# Patient Record
Sex: Male | Born: 1985 | Race: Black or African American | Hispanic: No | Marital: Single | Smoking: Current some day smoker
Health system: Southern US, Community
[De-identification: ages and names within clinical notes are randomized; demographics above are authoritative.]

---
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
## 2009-11-19 ENCOUNTER — Emergency Department (HOSPITAL_COMMUNITY): Admission: EM | Admit: 2009-11-19 | Discharge: 2009-11-19 | Payer: Self-pay | Admitting: Emergency Medicine

## 2011-10-23 ENCOUNTER — Emergency Department (INDEPENDENT_AMBULATORY_CARE_PROVIDER_SITE_OTHER)
Admission: EM | Admit: 2011-10-23 | Discharge: 2011-10-23 | Disposition: A | Discharge status: Home / Self Care | Payer: Self-pay | Source: Home / Self Care

## 2011-10-23 ENCOUNTER — Encounter (HOSPITAL_COMMUNITY): Payer: Self-pay | Admitting: *Deleted

## 2011-10-23 DIAGNOSIS — M545 Low back pain: Secondary | ICD-10-CM

## 2011-10-23 MED ORDER — CYCLOBENZAPRINE HCL 10 MG PO TABS: 10.0000 mg | ORAL_TABLET | Freq: Three times a day (TID) | ORAL | Status: AC | PRN

## 2011-10-23 MED ORDER — MELOXICAM 15 MG PO TABS: 15.0000 mg | ORAL_TABLET | Freq: Every day | ORAL | Status: AC

## 2011-10-23 NOTE — Discharge Instructions (Signed)
Thank you for coming in today.  You should get better in about a week. Come back sooner if you get worse. Come back or go to the emergency room if you notice new weakness new numbness problems walking or bowel or bladder problems.  Back Pain, Adult Low back pain is very common. About 1 in 5 people have back pain.The cause of low back pain is rarely dangerous. The pain often gets better over time.About half of people with a sudden onset of back pain feel better in just 2 weeks. About 8 in 10 people feel better by 6 weeks.  CAUSES Some common causes of back pain include:  Strain of the muscles or ligaments supporting the spine.   Wear and tear (degeneration) of the spinal discs.   Arthritis.   Direct injury to the back.  DIAGNOSIS Most of the time, the direct cause of low back pain is not known.However, back pain can be treated effectively even when the exact cause of the pain is unknown.Answering your caregiver's questions about your overall health and symptoms is one of the most accurate ways to make sure the cause of your pain is not dangerous. If your caregiver needs more information, he or she may order lab work or imaging tests (X-rays or MRIs).However, even if imaging tests show changes in your back, this usually does not require surgery. HOME CARE INSTRUCTIONS For many people, back pain returns.Since low back pain is rarely dangerous, it is often a condition that people can learn to [facility] their own.   Remain active. It is stressful on the back to sit or stand in one place. Do not sit, drive, or stand in one place for more than 30 minutes at a time. Take short walks on level surfaces as soon as pain allows.Try to increase the length of time you walk each day.   Do not stay in bed.Resting more than 1 or 2 days can delay your recovery.   Do not avoid exercise or work.Your body is made to move.It is not dangerous to be active, even though your back may hurt.Your back will  likely heal faster if you return to being active before your pain is gone.   Pay attention to your body when you bend and lift. Many people have less discomfortwhen lifting if they bend their knees, keep the load close to their bodies,and avoid twisting. Often, the most comfortable positions are those that put less stress on your recovering back.   Find a comfortable position to sleep. Use a firm mattress and lie on your side with your knees slightly bent. If you lie on your back, put a pillow under your knees.   Only take over-the-counter or prescription medicines as directed by your caregiver. Over-the-counter medicines to reduce pain and inflammation are often the most helpful.Your caregiver may prescribe muscle relaxant drugs.These medicines help dull your pain so you can more quickly return to your normal activities and healthy exercise.   Put ice on the injured area.   Put ice in a plastic bag.   Place a towel between your skin and the bag.   Leave the ice on for 15 to 20 minutes, 3 to 4 times a day for the first 2 to 3 days. After that, ice and heat may be alternated to reduce pain and spasms.   Ask your caregiver about trying back exercises and gentle massage. This may be of some benefit.   Avoid feeling anxious or stressed.Stress increases muscle tension and  can worsen back pain.It is important to recognize when you are anxious or stressed and learn ways to manage it.Exercise is a great option.  SEEK MEDICAL CARE IF:  You have pain that is not relieved with rest or medicine.   You have pain that does not improve in 1 week.   You have new symptoms.   You are generally not feeling well.  SEEK IMMEDIATE MEDICAL CARE IF:   You have pain that radiates from your back into your legs.   You develop new bowel or bladder control problems.   You have unusual weakness or numbness in your arms or legs.   You develop nausea or vomiting.   You develop abdominal pain.   You  feel faint.  Document Released: 05/07/2005 Document Revised: 04/26/2011 Document Reviewed: 09/25/2010 [facility] Patient Information 2012 [place], [place].

## 2011-10-23 NOTE — ED Notes (Signed)
Pt  treports    Low  Back  Pain     X  4  Days  He  Reports  Pain  Developed  About  4 hours     afetr  Bending and  Working on his  Car       Prior to the pain staring  He  Reports  Pain is  Worse  On movement  He  Ambulated  To the room with  A  Slow    Gait      He  Is  howver  Sitting upright on the  Exam table

## 2011-10-23 NOTE — ED Provider Notes (Signed)
William Vasquez is a 26 y.o. male who presents to Urgent Care today for low back pain starting 4 days ago.  Patient denies any injury. The pain does not radiate and he is not have weakness numbness loss of coordination difficulty walking or bowel or bladder dysfunction.  He took some ibuprofen which helped some. He has not had an injury like this in the past. He feels well otherwise.    PMH reviewed. Otherwise healthy young man History  Substance Use Topics  . Smoking status: Not on file  . Smokeless tobacco: Not on file  . Alcohol Use: Not on file   ROS as above Medications reviewed. No current facility-administered medications for this encounter.   Current Outpatient Prescriptions  Medication Sig Dispense Refill  . cyclobenzaprine (FLEXERIL) 10 MG tablet Take 1 tablet (10 mg total) by mouth 3 (three) times daily as needed for muscle spasms.  30 tablet  0  . meloxicam (MOBIC) 15 MG tablet Take 1 tablet (15 mg total) by mouth daily.  14 tablet  0    Exam:  BP 145/78  Pulse 82  Temp(Src) 98.4 F (36.9 C) (Oral)  Resp 16  SpO2 100% Gen: Well NAD Lungs: CTABL Nl WOB Heart: RRR no MRG Exts: Non edematous BL  LE, warm and well perfused.  Back: Nontender over spinal midline. Mildly tender over the SI joints bilaterally.  Strength and sensation is intact as are reflexes which are equal bilaterally. Patient can bend over and touch his toes stand on his toes and heels and walks normally.  He is able to get onto and off of the exam table by himself.  No results found for this or any previous visit (from the past 24 hour(s)). No results found.  Assessment and Plan: 26 y.o. male with low back muscle strain.  Plan to treat conservatively with meloxicam and Flexeril. Discussed warning signs or symptoms. Please see discharge instructions patient expresses understanding. Followup in 2 weeks if not improved.     Rodolph Bong, MD 10/23/11 Ebony Cargo

## 2011-10-23 NOTE — ED Provider Notes (Signed)
Medical screening examination/treatment/procedure(s) were performed by a resident physician and as supervising physician I was immediately available for consultation/collaboration.  Leslee Home, M.D.   Reuben Likes, MD 10/23/11 [phone]

## 2011-10-30 ENCOUNTER — Encounter (HOSPITAL_COMMUNITY): Payer: Self-pay

## 2011-10-30 ENCOUNTER — Emergency Department (INDEPENDENT_AMBULATORY_CARE_PROVIDER_SITE_OTHER)
Admission: EM | Admit: 2011-10-30 | Discharge: 2011-10-30 | Disposition: A | Discharge status: Home / Self Care | Payer: Self-pay | Source: Home / Self Care | Attending: Emergency Medicine | Admitting: Emergency Medicine

## 2011-10-30 DIAGNOSIS — L0291 Cutaneous abscess, unspecified: Secondary | ICD-10-CM

## 2011-10-30 MED ORDER — SULFAMETHOXAZOLE-TMP DS 800-160 MG PO TABS: 2.0000 | ORAL_TABLET | Freq: Two times a day (BID) | ORAL | Status: AC

## 2011-10-30 MED ORDER — MUPIROCIN 2 % EX OINT: TOPICAL_OINTMENT | Freq: Three times a day (TID) | CUTANEOUS | Status: AC

## 2011-10-30 NOTE — ED Notes (Signed)
New chest tattoo on 5-30 after having used Nair, clippers, and razor to remove hair. Since then , has developed infected bumps inches, arms, back

## 2011-10-30 NOTE — Discharge Instructions (Signed)
Abscess An abscess (boil or furuncle) is an infected area that contains a collection of pus.  SYMPTOMS Signs and symptoms of an abscess include pain, tenderness, redness, or hardness. You may feel a moveable soft area under your skin. An abscess can occur anywhere in the body.  TREATMENT  A surgical cut (incision) may be made over your abscess to drain the pus. Gauze may be packed into the space or a drain may be looped through the abscess cavity (pocket). This provides a drain that will allow the cavity to heal from the inside outwards. The abscess may be painful for a few days, but should feel much better if it was drained.  Your abscess, if seen early, may not have localized and may not have been drained. If not, another appointment may be required if it does not get better on its own or with medications. HOME CARE INSTRUCTIONS   Only take over-the-counter or prescription medicines for pain, discomfort, or fever as directed by your caregiver.   Take your antibiotics as directed if they were prescribed. Finish them even if you start to feel better.   Keep the skin and clothes clean around your abscess.   If the abscess was drained, you will need to use gauze dressing to collect any draining pus. Dressings will typically need to be changed 3 or more times a day.   The infection may spread by skin contact with others. Avoid skin contact as much as possible.   Practice good hygiene. This includes regular hand washing, cover any draining skin lesions, and do not share personal care items.   If you participate in sports, do not share athletic equipment, towels, whirlpools, or personal care items. Shower after every practice or tournament.   If a draining area cannot be adequately covered:   Do not participate in sports.   Children should not participate in day care until the wound has healed or drainage stops.   If your caregiver has given you a follow-up appointment, it is very important  to keep that appointment. Not keeping the appointment could result in a much worse infection, chronic or permanent injury, pain, and disability. If there is any problem keeping the appointment, you must call back to this facility for assistance.  SEEK MEDICAL CARE IF:   You develop increased pain, swelling, redness, drainage, or bleeding in the wound site.   You develop signs of generalized infection including muscle aches, chills, fever, or a general ill feeling.   You have an oral temperature above 102 F (38.9 C).  MAKE SURE YOU:   Understand these instructions.   Will watch your condition.   Will get help right away if you are not doing well or get worse.  Document Released: 02/14/2005 Document Revised: 04/26/2011 Document Reviewed: 12/09/2007 [employer] Patient Information 2012 [employer].Community-Associated MRSA CA-MRSA stands for community-associated methicillin-resistant Staphylococcus aureus. MRSA is a type of bacteria that is resistant to some common antibiotics. It can cause infections in the skin and many other places in the body. Staphylococcus aureus, often called "staph," is a bacteria that normally lives on the skin or in the nose. Staph on the surface of the skin or in the nose does not cause problems. However, if the staph enters the body through a cut, wound, or break in the skin, an infection can happen. Up until recently, infections with the MRSA type of staph mainly occurred in hospitals and other healthcare settings. There are now increasing problems with MRSA infections in   the community as well. Infections with MRSA may be very serious or even life-threatening. CA-MRSA is becoming more common. It is known to spread in crowded settings, in jails and prisons, and in situations where there is close skin-to-skin contact, such as during sporting events or in locker rooms. MRSA can be spread through shared items, such as children's toys, razors, towels, or sports equipment.    CAUSES All staph, including MRSA, are normally harmless unless they enter the body through a scratch, cut, or wound, such as with surgery. All staph, including MRSA, can be spread from person-to-person by touching contaminated objects or through direct contact.  MRSA now causes illness in people who have not been in hospitals or other healthcare facilities. Cases of MRSA diseases in the community have been associated with:   Recent antibiotic use.   Sharing contaminated towels or clothes.   Having active skin diseases.   Participating in contact sports.   Living in crowded settings.   Intravenous (IV) drug use.   Community-associated MRSA infections are usually skin infections, but may cause other severe illnesses.   Staph bacteria are one of the most common causes of skin infection. However, they are also a common cause of pneumonia, bone or joint infections, and bloodstream infections.  DIAGNOSIS Diagnosis of MRSA is done by cultures of fluid samples that may come from:  Swabs taken from cuts or wounds in infected areas.   Nasal swabs.   Saliva or deep cough specimens from the lungs (sputum).   Urine.   Blood.  Many people are "colonized" with MRSA but have no signs of infection. This means that people carry the MRSA germ on their skin or in their nose and may never develop MRSA infection.  TREATMENT  Treatment varies and is based on how serious, how deep, or how extensive the infection is. For example:  Some skin infections, such as a small boil or abscess, may be treated by draining yellowish-white fluid (pus) from the site of the infection.   Deeper or more widespread soft tissue infections are usually treated with surgery to drain pus and with antibiotic medicine given by vein or by mouth. This may be recommended even if you are pregnant.   Serious infections may require a hospital stay.  If antibiotics are given, they may be needed for several  weeks. PREVENTION Because many people are colonized with staph, including MRSA, preventing the spread of the bacteria from person-to-person is most important. The best way to prevent the spread of bacteria and other germs is through proper hand washing or by using alcohol-based hand disinfectants. The following are other ways to help prevent MRSA infection within community settings.   Wash your hands frequently with soap and water for at least 15 seconds. Otherwise, use alcohol-based hand disinfectants when soap and water is not available.   Make sure people who live with you wash their hands often, too.   Do not share personal items. For example, avoid sharing razors and other personal hygiene items, towels, clothing, and athletic equipment.   Wash and dry your clothes and bedding at the warmest temperatures recommended on the labels.   Keep wounds covered. Pus from infected sores may contain MRSA and other bacteria. Keep cuts and abrasions clean and covered with germ-free (sterile), dry bandages until they are healed.   If you have a wound that appears infected, ask your caregiver if a culture for MRSA and other bacteria should be done.   If you are breastfeeding,   talk to your caregiver about MRSA. You may be asked to temporarily stop breastfeeding.  HOME CARE INSTRUCTIONS   Take your antibiotics as directed. Finish them even if you start to feel better.   Avoid close contact with those around you as much as possible. Do not use towels, razors, toothbrushes, bedding, or other items that will be used by others.   To fight the infection, follow your caregiver's instructions for wound care. Wash your hands before and after changing your bandages.   If you have an intravascular device, such as a catheter, make sure you know how to care for it.   Be sure to tell any healthcare providers that you have MRSA so they are aware of your infection.  SEEK IMMEDIATE MEDICAL CARE IF:  The infection  appears to be getting worse. Signs include:   Increased warmth, redness, or tenderness around the wound site.   A red line that extends from the infection site.   A dark color in the area around the infection.   Wound drainage that is tan, yellow, or green.   A bad smell coming from the wound.   You feel sick to your stomach (nauseous) and throw up (vomit) or cannot keep medicine down.   You have a fever.   Your baby is older than 3 months with a rectal temperature of 102 F (38.9 C) or higher.   Your baby is 3 months old or younger with a rectal temperature of 100.4 F (38 C) or higher.   You have difficulty breathing.  MAKE SURE YOU:   Understand these instructions.   Will watch your condition.   Will get help right away if you are not doing well or get worse.  Document Released: 08/10/2005 Document Revised: 04/26/2011 Document Reviewed: 08/10/2010 [employer] Patient Information 2012 [employer]. 

## 2011-10-30 NOTE — ED Provider Notes (Signed)
Chief Complaint  Patient presents with  . Skin Problem    History of Present Illness:   The patient is a 26 year old male who had a large tattoo on his chest on May 28. Thereafter he noted a small pustule on his anterior chest in the area of the tattoo. Since then he's developed about a dozen more pustules both in the area of the tattoo and outside the area as well. These have drained a small amount of pus. He has not had any fever or chills. The lesions have been located in the chest and upper extremities and he has a large one in his right axilla. He has no prior history of skin infections, abscesses, or MRSA. He has no history of diabetes.  Review of Systems:  Other than noted above, the patient denies any of the following symptoms: Systemic:  No fever, chills, sweats, weight loss, or fatigue. ENT:  No nasal congestion, rhinorrhea, sore throat, swelling of lips, tongue or throat. Resp:  No cough, wheezing, or shortness of breath. Skin:  No rash, itching, nodules, or suspicious lesions.  PMFSH:  Past medical history, family history, social history, meds, and allergies were reviewed.  Physical Exam:   Vital signs:  BP 121/73  Pulse 78  Temp(Src) 98.3 F (36.8 C) (Oral)  Resp 16  SpO2 98% Gen:  Alert, oriented, in no distress. ENT:  Pharynx clear, no intraoral lesions, moist mucous membranes. Lungs:  Clear to auscultation. Skin:  He has about a dozen pustules ranging in size from just a few millimeters to the largest being in the right axilla which was about a centimeter. Some of these have dried up and crusted over, some are draining a small amount of pus, and the lesion in the right axilla feels firm, but there is no fluctuance. These are all located in the chest and upper extremities. They're not on the face, head, neck, back, genital area, or lower extremities.  Other Labs Obtained at Urgent Care Center:  One of the draining lesions on the left upper arm was cultured.  Results are  pending at this time and we will call about any positive results.  Assessment:  The encounter diagnosis was Abscess. He has multiple small abscesses, these are probably staph for MRSA and most likely are due to infection that he caught when he got the tattoo.  Plan:   1.  The following meds were prescribed:   New Prescriptions   MUPIROCIN OINTMENT (BACTROBAN) 2 %    Apply topically 3 (three) times daily.   SULFAMETHOXAZOLE-TRIMETHOPRIM (BACTRIM DS) 800-160 MG PER TABLET    Take 2 tablets by mouth 2 (two) times daily.   2.  The patient was instructed in symptomatic care and handouts were given. 3.  The patient was told to return if becoming worse in any way, if no better in 3 or 4 days, and given some red flag symptoms that would indicate earlier return.     Reuben Likes, MD 10/30/11 [phone]

## 2011-11-02 LAB — WOUND CULTURE

## 2011-11-05 ENCOUNTER — Telehealth (HOSPITAL_COMMUNITY): Payer: Self-pay | Admitting: *Deleted

## 2011-11-05 NOTE — ED Notes (Signed)
Pt. called back and verified x 2. Pt. given results and told he was adequately treated with Bactrim DS. Pt. given MRSA instructions and voiced understanding. Vassie Moselle 11/05/2011

## 2013-10-23 ENCOUNTER — Encounter (HOSPITAL_COMMUNITY): Payer: Self-pay | Admitting: Emergency Medicine

## 2013-10-23 DIAGNOSIS — R55 Syncope and collapse: Secondary | ICD-10-CM | POA: Insufficient documentation

## 2013-10-23 DIAGNOSIS — F17200 Nicotine dependence, unspecified, uncomplicated: Secondary | ICD-10-CM | POA: Insufficient documentation

## 2013-10-23 DIAGNOSIS — Z79899 Other long term (current) drug therapy: Secondary | ICD-10-CM | POA: Insufficient documentation

## 2013-10-23 DIAGNOSIS — R42 Dizziness and giddiness: Secondary | ICD-10-CM | POA: Insufficient documentation

## 2013-10-23 LAB — CBC WITH DIFFERENTIAL/PLATELET
Basophils Absolute: 0 10*3/uL (ref 0.0–0.1)
Basophils Relative: 0 % (ref 0–1)
EOS ABS: 0.3 10*3/uL (ref 0.0–0.7)
EOS PCT: 5 % (ref 0–5)
HCT: 38.9 % — ABNORMAL LOW (ref 39.0–52.0)
Hemoglobin: 12.9 g/dL — ABNORMAL LOW (ref 13.0–17.0)
LYMPHS ABS: 1.6 10*3/uL (ref 0.7–4.0)
LYMPHS PCT: 27 % (ref 12–46)
MCH: 28.2 pg (ref 26.0–34.0)
MCHC: 33.2 g/dL (ref 30.0–36.0)
MCV: 84.9 fL (ref 78.0–100.0)
MONO ABS: 0.8 10*3/uL (ref 0.1–1.0)
Monocytes Relative: 14 % — ABNORMAL HIGH (ref 3–12)
NEUTROS ABS: 3.1 10*3/uL (ref 1.7–7.7)
Neutrophils Relative %: 54 % (ref 43–77)
Platelets: 242 10*3/uL (ref 150–400)
RBC: 4.58 MIL/uL (ref 4.22–5.81)
RDW: 12.2 % (ref 11.5–15.5)
WBC: 5.9 10*3/uL (ref 4.0–10.5)

## 2013-10-23 NOTE — ED Notes (Signed)
The pt has fainted x 2 tonight one hour ago while getting his hair cut.  He last ate 1800 and then he drank a mixed drink.  At present he feels ok

## 2013-10-24 ENCOUNTER — Emergency Department (HOSPITAL_COMMUNITY)
Admission: EM | Admit: 2013-10-24 | Discharge: 2013-10-24 | Disposition: A | Discharge status: Home / Self Care | Payer: Self-pay | Attending: Emergency Medicine | Admitting: Emergency Medicine

## 2013-10-24 ENCOUNTER — Emergency Department (HOSPITAL_COMMUNITY): Payer: Self-pay

## 2013-10-24 DIAGNOSIS — R55 Syncope and collapse: Secondary | ICD-10-CM

## 2013-10-24 LAB — COMPREHENSIVE METABOLIC PANEL
ALBUMIN: 4 g/dL (ref 3.5–5.2)
ALT: 16 U/L (ref 0–53)
AST: 17 U/L (ref 0–37)
Alkaline Phosphatase: 84 U/L (ref 39–117)
BUN: 9 mg/dL (ref 6–23)
CHLORIDE: 103 meq/L (ref 96–112)
CO2: 27 mEq/L (ref 19–32)
CREATININE: 0.97 mg/dL (ref 0.50–1.35)
Calcium: 9.7 mg/dL (ref 8.4–10.5)
Glucose, Bld: 85 mg/dL (ref 70–99)
POTASSIUM: 3.6 meq/L — AB (ref 3.7–5.3)
Sodium: 141 mEq/L (ref 137–147)
TOTAL PROTEIN: 7.7 g/dL (ref 6.0–8.3)
Total Bilirubin: 0.3 mg/dL (ref 0.3–1.2)

## 2013-10-24 LAB — I-STAT TROPONIN, ED: Troponin i, poc: 0 ng/mL (ref 0.00–0.08)

## 2013-10-24 MED ORDER — SODIUM CHLORIDE 0.9 % IV BOLUS (SEPSIS)
1000.0000 mL | Freq: Once | INTRAVENOUS | Status: AC
  Administered 2013-10-24: 1000 mL via INTRAVENOUS

## 2013-10-24 NOTE — ED Provider Notes (Signed)
CSN: [id number]     Arrival date & time 10/23/13  2316 History   First MD Initiated Contact with Patient 10/24/13 0209     Chief Complaint  Patient presents with  . Near Syncope     (Consider location/radiation/quality/duration/timing/severity/associated sxs/prior Treatment) HPI Patient is a generally healthy 28 year old man who presents after an episode of near syncope. The patient was getting his hair trimmed a barber shop. He stood up from the chair, felt lightheaded and had to sit back down. The same thing happen a couple minutes later. He drank some water and felt better.  The patient denies any history of similar symptoms. He has not had shortness of breath or chest pain. He did his by mouth intake to less than normal today. He has not had any melena or hematochezia. He's not taking any medications. He denies alcohol and recreational drug use today. He is asymptomatic at this time other than feeling somewhat tired.  History reviewed. No pertinent past medical history. History reviewed. No pertinent past surgical history. No family history on file. History  Substance Use Topics  . Smoking status: Current Every Day Smoker  . Smokeless tobacco: Not on file  . Alcohol Use: Yes    Review of Systems Ten point review of symptoms performed and is negative with the exception of symptoms noted above.     Allergies  Review of patient's allergies indicates no known allergies.  Home Medications   Prior to Admission medications   Not on File   BP 129/72  Pulse 74  Temp(Src) 98.5 F (36.9 C) (Oral)  Resp 13  Ht 6\' 1"  (1.854 m)  Wt 235 lb 6.4 oz (106.777 kg)  BMI 31.06 kg/m2  SpO2 98% Physical Exam Gen: well developed and well nourished appearing Head: NCAT Eyes: PERL, EOMI Nose: no epistaixis or rhinorrhea Mouth/throat: mucosa is moist and pink Neck: supple, no stridor Lungs: CTA B, no wheezing, rhonchi or rales CV: RRR, no murmur, extremities appear well perfused.  Abd:  soft, notender, nondistended Back: no ttp, no cva ttp Skin: warm and dry Ext: normal to inspection, no dependent edema Neuro: CN ii-xii grossly intact, no focal deficits Psyche; normal affect,  calm and cooperative.   ED Course  Procedures (including critical care time) Labs Review Results for orders placed during the hospital encounter of 10/24/13 (from the past 24 hour(s))  CBC WITH DIFFERENTIAL     Status: Abnormal   Collection Time    10/23/13 11:38 PM      Result Value Ref Range   WBC 5.9  4.0 - 10.5 K/uL   RBC 4.58  4.22 - 5.81 MIL/uL   Hemoglobin 12.9 (*) 13.0 - 17.0 g/dL   HCT 72.5 (*) 36.6 - 44.0 %   MCV 84.9  78.0 - 100.0 fL   MCH 28.2  26.0 - 34.0 pg   MCHC 33.2  30.0 - 36.0 g/dL   RDW 34.7  42.5 - 95.6 %   Platelets 242  150 - 400 K/uL   Neutrophils Relative % 54  43 - 77 %   Neutro Abs 3.1  1.7 - 7.7 K/uL   Lymphocytes Relative 27  12 - 46 %   Lymphs Abs 1.6  0.7 - 4.0 K/uL   Monocytes Relative 14 (*) 3 - 12 %   Monocytes Absolute 0.8  0.1 - 1.0 K/uL   Eosinophils Relative 5  0 - 5 %   Eosinophils Absolute 0.3  0.0 - 0.7 K/uL   Basophils  Relative 0  0 - 1 %   Basophils Absolute 0.0  0.0 - 0.1 K/uL  COMPREHENSIVE METABOLIC PANEL     Status: Abnormal   Collection Time    10/23/13 11:38 PM      Result Value Ref Range   Sodium 141  137 - 147 mEq/L   Potassium 3.6 (*) 3.7 - 5.3 mEq/L   Chloride 103  96 - 112 mEq/L   CO2 27  19 - 32 mEq/L   Glucose, Bld 85  70 - 99 mg/dL   BUN 9  6 - 23 mg/dL   Creatinine, Ser 1.610.97  0.50 - 1.35 mg/dL   Calcium 9.7  8.4 - 09.610.5 mg/dL   Total Protein 7.7  6.0 - 8.3 g/dL   Albumin 4.0  3.5 - 5.2 g/dL   AST 17  0 - 37 U/L   ALT 16  0 - 53 U/L   Alkaline Phosphatase 84  39 - 117 U/L   Total Bilirubin 0.3  0.3 - 1.2 mg/dL   GFR calc non Af Amer >90  >90 mL/min   GFR calc Af Amer >90  >90 mL/min  I-STAT TROPOININ, ED     Status: None   Collection Time    10/24/13  2:27 AM      Result Value Ref Range   Troponin i, poc 0.00   0.00 - 0.08 ng/mL   Comment 3             CXR: normal cardiac silloute, normal appearing mediastinum, no infiltrates, no acute process identified.   EKG: nsr, no acute ischemic changes, normal intervals, normal axis, normal qrs complex  MDM   DDX: volume depletion, electrolyte disturbance, AKI, dysrthmia unlikely in this healthy young man.   ED work up is reassuring with normal CBC, CXR, EKG. Patient is feeling better after NS bolus. Stable for d/c.     Brandt LoosenJulie Manly, MD 10/24/13 [phone]

## 2013-10-24 NOTE — Discharge Instructions (Signed)
Near-Syncope °Near-syncope (commonly known as near fainting) is sudden weakness, dizziness, or feeling like you might pass out. During an episode of near-syncope, you may also develop pale skin, have tunnel vision, or feel sick to your stomach (nauseous). Near-syncope may occur when getting up after sitting or while standing for a long time. It is caused by a sudden decrease in blood flow to the brain. This decrease can result from various causes or triggers, most of which are not serious. However, because near-syncope can sometimes be a sign of something serious, a medical evaluation is required. The specific cause is often not determined. °HOME CARE INSTRUCTIONS  °Monitor your condition for any changes. The following actions may help to alleviate any discomfort you are experiencing: °· Have someone stay with you until you feel stable. °· Lie down right away if you start feeling like you might faint. Breathe deeply and steadily. Wait until all the symptoms have passed. Most of these episodes last only a few minutes. You may feel tired for several hours.   °· Drink enough fluids to keep your urine clear or pale yellow.   °· If you are taking blood pressure or heart medicine, get up slowly when seated or lying down. Take several minutes to sit and then stand. This can reduce dizziness. °· Follow up with your health care provider as directed.  °SEEK IMMEDIATE MEDICAL CARE IF:  °· You have a severe headache.   °· You have unusual pain in the chest, abdomen, or back.   °· You are bleeding from the mouth or rectum, or you have black or tarry stool.   °· You have an irregular or very fast heartbeat.   °· You have repeated fainting or have seizure-like jerking during an episode.   °· You faint when sitting or lying down.   °· You have confusion.   °· You have difficulty walking.   °· You have severe weakness.   °· You have vision problems.   °MAKE SURE YOU:  °· Understand these instructions. °· Will watch your  condition. °· Will get help right away if you are not doing well or get worse. °Document Released: 05/07/2005 Document Revised: 01/07/2013 Document Reviewed: 10/10/2012 °[employer]® Patient Information ©2014 [employer]. ° °

## 2013-10-24 NOTE — ED Provider Notes (Signed)
CSN: [id number]     Arrival date & time 10/23/13  2316 History   First MD Initiated Contact with Patient 10/24/13 0209     Chief Complaint  Patient presents with  . Near Syncope     (Consider location/radiation/quality/duration/timing/severity/associated sxs/prior Treatment) HPI Patient is a generally healthy 27 and he comes in for 2 episodes of near-syncope. 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 He stood up, felt lightheaded and had to sit back on a chair. The same thing occurred about 20 minutes later. He then sat been trying to follow for her. He felt better.  He feels fatigued at this time. However, it is after 2:00 in the morning. The patient reports normal by mouth intake. He has not had any chest pain, shortness of breath or focal neurologic deficits. No headache. No history of similar symptoms. He says he has had a couple alcoholic beverages prior to this event. Denies any route recent recreational drug use.  History reviewed. No pertinent past medical history. History reviewed. No pertinent past surgical history. No family history on file. History  Substance Use Topics  . Smoking status: Current Every Day Smoker  . Smokeless tobacco: Not on file  . Alcohol Use: Yes    Review of Systems Ten point review of symptoms performed and is negative with the exception of symptoms noted above.     Allergies  Review of patient's allergies indicates no known allergies.  Home Medications   Prior to Admission medications   Medication Sig Start Date End Date Taking? Authorizing Provider  loratadine (CLARITIN) 10 MG tablet Take 10 mg by mouth daily.   Yes Historical Provider, MD   BP 129/72  Pulse 74  Temp(Src) 98.5 F (36.9 C) (Oral)  Resp 13  Ht 6\' 1"  (1.854 m)  Wt 235 lb 6.4 oz (106.777 kg)  BMI 31.06 kg/m2  SpO2 98% Physical Exam Gen: well developed and well nourished appearing Head: NCAT Eyes: PERL, EOMI Nose: no epistaixis or  rhinorrhea Mouth/throat: mucosa is moist and pink Neck: supple, no stridor Lungs: CTA B, no wheezing, rhonchi or rales CV: RRR, no murmur, extremities appear well perfused.Orthostatic VS performed by me and are normal.  Abd: soft, notender, nondistended Back: no ttp, no cva ttp Skin: warm and dry Ext: normal to inspection, no dependent edema Neuro: CN ii-xii grossly intact, no focal deficits Psyche; normal affect,  calm and cooperative.   ED Course  Procedures (including critical care time) Labs Review  Results for orders placed during the hospital encounter of 10/24/13 (from the past 24 hour(s))  CBC WITH DIFFERENTIAL     Status: Abnormal   Collection Time    10/23/13 11:38 PM      Result Value Ref Range   WBC 5.9  4.0 - 10.5 K/uL   RBC 4.58  4.22 - 5.81 MIL/uL   Hemoglobin 12.9 (*) 13.0 - 17.0 g/dL   HCT 27.7 (*) 82.4 - 23.5 %   MCV 84.9  78.0 - 100.0 fL   MCH 28.2  26.0 - 34.0 pg   MCHC 33.2  30.0 - 36.0 g/dL   RDW 36.1  44.3 - 15.4 %   Platelets 242  150 - 400 K/uL   Neutrophils Relative % 54  43 - 77 %   Neutro Abs 3.1  1.7 - 7.7 K/uL   Lymphocytes Relative 27  12 - 46 %   Lymphs Abs 1.6  0.7 - 4.0 K/uL   Monocytes Relative 14 (*)  3 - 12 %   Monocytes Absolute 0.8  0.1 - 1.0 K/uL   Eosinophils Relative 5  0 - 5 %   Eosinophils Absolute 0.3  0.0 - 0.7 K/uL   Basophils Relative 0  0 - 1 %   Basophils Absolute 0.0  0.0 - 0.1 K/uL  COMPREHENSIVE METABOLIC PANEL     Status: Abnormal   Collection Time    10/23/13 11:38 PM      Result Value Ref Range   Sodium 141  137 - 147 mEq/L   Potassium 3.6 (*) 3.7 - 5.3 mEq/L   Chloride 103  96 - 112 mEq/L   CO2 27  19 - 32 mEq/L   Glucose, Bld 85  70 - 99 mg/dL   BUN 9  6 - 23 mg/dL   Creatinine, Ser 1.610.97  0.50 - 1.35 mg/dL   Calcium 9.7  8.4 - 09.610.5 mg/dL   Total Protein 7.7  6.0 - 8.3 g/dL   Albumin 4.0  3.5 - 5.2 g/dL   AST 17  0 - 37 U/L   ALT 16  0 - 53 U/L   Alkaline Phosphatase 84  39 - 117 U/L   Total Bilirubin 0.3   0.3 - 1.2 mg/dL   GFR calc non Af Amer >90  >90 mL/min   GFR calc Af Amer >90  >90 mL/min   EKG: nsr, no acute ischemic changes, normal intervals, normal axis, normal qrs complex  MDM   DDX: CBC, BMP wnl. EKG wnl. Do not suspect cardiogenic syncope. Suspect a vasovagal mediated episode potentiated by alcohol. No signs of significant volume dehydration or anemia. EKG is wnl. WE will tx with NS in anticipation of d/c home.     Brandt LoosenJulie Luise Yamamoto, MD 10/24/13 [phone]

## 2014-03-17 ENCOUNTER — Emergency Department (HOSPITAL_COMMUNITY)
Admission: EM | Admit: 2014-03-17 | Discharge: 2014-03-17 | Disposition: A | Discharge status: Home / Self Care | Payer: Self-pay | Attending: Emergency Medicine | Admitting: Emergency Medicine

## 2014-03-17 ENCOUNTER — Encounter (HOSPITAL_COMMUNITY): Payer: Self-pay | Admitting: Emergency Medicine

## 2014-03-17 ENCOUNTER — Emergency Department (HOSPITAL_COMMUNITY): Payer: Self-pay

## 2014-03-17 DIAGNOSIS — Z720 Tobacco use: Secondary | ICD-10-CM | POA: Insufficient documentation

## 2014-03-17 DIAGNOSIS — Y9389 Activity, other specified: Secondary | ICD-10-CM | POA: Insufficient documentation

## 2014-03-17 DIAGNOSIS — S60131A Contusion of right middle finger with damage to nail, initial encounter: Secondary | ICD-10-CM | POA: Insufficient documentation

## 2014-03-17 DIAGNOSIS — S62639A Displaced fracture of distal phalanx of unspecified finger, initial encounter for closed fracture: Secondary | ICD-10-CM

## 2014-03-17 DIAGNOSIS — L03111 Cellulitis of right axilla: Secondary | ICD-10-CM | POA: Insufficient documentation

## 2014-03-17 DIAGNOSIS — M25511 Pain in right shoulder: Secondary | ICD-10-CM

## 2014-03-17 DIAGNOSIS — Z8614 Personal history of Methicillin resistant Staphylococcus aureus infection: Secondary | ICD-10-CM | POA: Insufficient documentation

## 2014-03-17 DIAGNOSIS — M79646 Pain in unspecified finger(s): Secondary | ICD-10-CM

## 2014-03-17 DIAGNOSIS — S4991XA Unspecified injury of right shoulder and upper arm, initial encounter: Secondary | ICD-10-CM | POA: Insufficient documentation

## 2014-03-17 DIAGNOSIS — Y9289 Other specified places as the place of occurrence of the external cause: Secondary | ICD-10-CM | POA: Insufficient documentation

## 2014-03-17 DIAGNOSIS — X58XXXA Exposure to other specified factors, initial encounter: Secondary | ICD-10-CM | POA: Insufficient documentation

## 2014-03-17 DIAGNOSIS — S62632A Displaced fracture of distal phalanx of right middle finger, initial encounter for closed fracture: Secondary | ICD-10-CM | POA: Insufficient documentation

## 2014-03-17 DIAGNOSIS — L0291 Cutaneous abscess, unspecified: Secondary | ICD-10-CM

## 2014-03-17 MED ORDER — LIDOCAINE-EPINEPHRINE 2 %-1:100000 IJ SOLN
20.0000 mL | Freq: Once | INTRAMUSCULAR | Status: AC
  Administered 2014-03-17: 20 mL via INTRADERMAL
  Filled 2014-03-17: qty 1

## 2014-03-17 MED ORDER — SULFAMETHOXAZOLE-TRIMETHOPRIM 800-160 MG PO TABS: 1.0000 | ORAL_TABLET | Freq: Two times a day (BID) | ORAL | Status: AC

## 2014-03-17 MED ORDER — NAPROXEN 500 MG PO TABS: 500.0000 mg | ORAL_TABLET | Freq: Two times a day (BID) | ORAL | Status: DC

## 2014-03-17 MED ORDER — TRAMADOL HCL 50 MG PO TABS: 50.0000 mg | ORAL_TABLET | Freq: Four times a day (QID) | ORAL | Status: AC | PRN

## 2014-03-17 MED ORDER — IBUPROFEN 800 MG PO TABS
800.0000 mg | ORAL_TABLET | Freq: Once | ORAL | Status: AC
  Administered 2014-03-17: 800 mg via ORAL
  Filled 2014-03-17: qty 1

## 2014-03-17 NOTE — ED Notes (Signed)
Pt c/o pain to middle finger on rt hand from dropping a 35 # weight on finger yesterday.  Also wants to be seen for assorted "boils" on skin.

## 2014-03-17 NOTE — ED Provider Notes (Signed)
Medical screening examination/treatment/procedure(s) were performed by non-physician practitioner and as supervising physician I was immediately available for consultation/collaboration.   EKG Interpretation None        Layla MawKristen N Harneet Noblett, DO 03/17/14 1611

## 2014-03-17 NOTE — ED Notes (Signed)
Ortho called 

## 2014-03-17 NOTE — ED Provider Notes (Signed)
CSN: [id number]     Arrival date & time 03/17/14  [id number] History   First MD Initiated Contact with Patient 03/17/14 1012     Chief Complaint  Patient presents with  . Hand Pain     (Consider location/radiation/quality/duration/timing/severity/associated sxs/prior Treatment) HPI  William Vasquez this 28 year old male who presents emergency department with several complaints. 1) patient complains of pain in the right distal third finger after dropping a 35 pound weight on it yesterday. He had immediate pain and swelling. He has noticed bruising under the distal nail. He had a lot of throbbing pain yesterday however that has diminished. He denies any change in sensations. Pain is currently 7 out of 10. 2) patient complains of pain in the right fifth digit at the PIP joint. States that he was lifting weights several days ago and jammed his finger. He felt it pop and has had pain and swelling in that joint since that time. He feels as if he might have dislocated joint. He continues to have swelling rates the pain at 5 out of 5. He has difficulty making a fist due to swelling. Denies changes in sensation or color. 3) patient complains of pain in the anterior right shoulder after heavy bench pressing and bicep workout yesterday. He denies a history of pain or injury in the right shoulder. He has pain with flexion. He is having some difficulties completing activities of daily living and for today due to hand and shoulder pain. He took 800 mg of ibuprofen without relief. He denies weakness 4) the patient has a history of MRSA. He complains of a boil in the right axilla which is draining mild amount of purulence. Pain is rated at 3 out of 10. It does not radiate. He denies fevers, chills, nausea, vomiting, myalgias or arthralgias.   History reviewed. No pertinent past medical history. No past surgical history on file. No family history on file. History  Substance Use Topics  . Smoking status: Current  Every Day Smoker  . Smokeless tobacco: Not on file  . Alcohol Use: Yes    Review of Systems   Ten systems reviewed and are negative for acute change, except as noted in the HPI.   Allergies  Review of patient's allergies indicates no known allergies.  Home Medications   Prior to Admission medications   Medication Sig Start Date End Date Taking? Authorizing Provider  loratadine (CLARITIN) 10 MG tablet Take 10 mg by mouth daily.    Historical Provider, MD   BP 156/88  Pulse 65  Temp(Src) 97.9 F (36.6 C) (Oral)  Resp 17  SpO2 98% Physical Exam  Nursing note and vitals reviewed. Constitutional: He appears well-developed and well-nourished. No distress.  HENT:  Head: Normocephalic and atraumatic.  Eyes: Conjunctivae are normal. No scleral icterus.  Neck: Normal range of motion. Neck supple.  Cardiovascular: Normal rate, regular rhythm and normal heart sounds.   Pulmonary/Chest: Effort normal and breath sounds normal. No respiratory distress.  Abdominal: Soft. There is no tenderness.  Musculoskeletal: He exhibits no edema.  1) right hand examination reveals bruising of the distal right third finger with subungual hematoma proximal to the lunate line. Mild swelling at the distal end bruising on the pad of the finger. Full range of motion. Tender to palpation. 2) right fifth digit swelling, no discoloration, tender to palpation at the PIP. Unable to flex fully at the PIP. Full extension and strength. 3) right shoulder with full strength and range of motion. He is tender  to palpation over the coracoid process and the short head of the biceps tendon.  Neurological: He is alert.  Skin: Skin is warm and dry. He is not diaphoretic.  2 cm right axillary nodule with purulent punctate drainage, no surrounding induration.  Psychiatric: His behavior is normal.    ED Course  Procedures (including critical care time) Labs Review Labs Reviewed - No data to display  Imaging Review No  results found.   EKG Interpretation None     INCISION AND DRAINAGE Performed by: Arthor CaptainHarris, Laquesha Holcomb Consent: Verbal consent obtained. Risks and benefits: risks, benefits and alternatives were discussed Type: abscess  Body area: Right axilla  Anesthesia: local infiltration  Incision was made with a scalpel.  Local anesthetic: lidocaine 2% w epinephrine  Anesthetic total: 4 ml  Complexity: complex Blunt dissection to break up loculations  Drainage: purulent  Drainage amount: Moderate   Patient tolerance: Patient tolerated the procedure well with no immediate complications.     MDM   Final diagnoses:  Finger pain  Closed fracture of tuft of distal phalanx of finger, initial encounter  Abscess  Right shoulder pain    Patient placed in finger splint. Pain medication including tramadol and naproxen at discharge. Patient will get Bactrim for his abscess considering his history of MRSA. Follow-up with orthopedics. Work note given.    Arthor CaptainAbigail Lameisha Schuenemann, PA-C 03/17/14 1203

## 2014-03-17 NOTE — Discharge Instructions (Signed)
Take your medications as directed. Only take tramadol for severe pain. Do not drink alcohol with this medication. Follow up with the orthopedist for your shoulder (yates) and coley for your hand. Take the naproxen every day for the next 10 days with food. Rest and ice the shoulder and hand. Avoid activity involving your R shoulder such as heavy weight lifting.    Abscess Care After An abscess (also called a boil or furuncle) is an infected area that contains a collection of pus. Signs and symptoms of an abscess include pain, tenderness, redness, or hardness, or you may feel a moveable soft area under your skin. An abscess can occur anywhere in the body. The infection may spread to surrounding tissues causing cellulitis. A cut (incision) by the surgeon was made over your abscess and the pus was drained out. Gauze may have been packed into the space to provide a drain that will allow the cavity to heal from the inside outwards. The boil may be painful for 5 to 7 days. Most people with a boil do not have high fevers. Your abscess, if seen early, may not have localized, and may not have been lanced. If not, another appointment may be required for this if it does not get better on its own or with medications. HOME CARE INSTRUCTIONS   Only take over-the-counter or prescription medicines for pain, discomfort, or fever as directed by your caregiver.  When you bathe, soak and then remove gauze or iodoform packs at least daily or as directed by your caregiver. You may then wash the wound gently with mild soapy water. Repack with gauze or do as your caregiver directs. SEEK IMMEDIATE MEDICAL CARE IF:   You develop increased pain, swelling, redness, drainage, or bleeding in the wound site.  You develop signs of generalized infection including muscle aches, chills, fever, or a general ill feeling.  An oral temperature above 102 F (38.9 C) develops, not controlled by medication. See your caregiver for a  recheck if you develop any of the symptoms described above. If medications (antibiotics) were prescribed, take them as directed. Document Released: 11/23/2004 Document Revised: 07/30/2011 Document Reviewed: 07/21/2007 [facility] Patient Information 2015 [place], [place]. This information is not intended to replace advice given to you by your health care provider. Make sure you discuss any questions you have with your health care provider.  Finger Fracture Fractures of fingers are breaks in the bones of the fingers. There are many types of fractures. There are different ways of treating these fractures. Your health care provider will discuss the best way to treat your fracture. CAUSES Traumatic injury is the main cause of broken fingers. These include:  Injuries while playing sports.  Workplace injuries.  Falls. RISK FACTORS Activities that can increase your risk of finger fractures include:  Sports.  Workplace activities that involve machinery.  A condition called osteoporosis, which can make your bones less dense and cause them to fracture more easily. SIGNS AND SYMPTOMS The main symptoms of a broken finger are pain and swelling within 15 minutes after the injury. Other symptoms include:  Bruising of your finger.  Stiffness of your finger.  Numbness of your finger.  Exposed bones (compound fracture) if the fracture is severe. DIAGNOSIS  The best way to diagnose a broken bone is with X-ray imaging. Additionally, your health care provider will use this X-ray image to evaluate the position of the broken finger bones.  TREATMENT  Finger fractures can be treated with:   Nonreduction--This means  the bones are in place. The finger is splinted without changing the positions of the bone pieces. The splint is usually left on for about a week to 10 days. This will depend on your fracture and what your health care provider thinks.  Closed reduction--The bones are put back into position  without using surgery. The finger is then splinted.  Open reduction and internal fixation--The fracture site is opened. Then the bone pieces are fixed into place with pins or some type of hardware. This is seldom required. It depends on the severity of the fracture. HOME CARE INSTRUCTIONS   Follow your health care provider's instructions regarding activities, exercises, and physical therapy.  Only take over-the-counter or prescription medicines for pain, discomfort, or fever as directed by your health care provider. SEEK MEDICAL CARE IF: You have pain or swelling that limits the motion or use of your fingers. SEEK IMMEDIATE MEDICAL CARE IF:  Your finger becomes numb. MAKE SURE YOU:   Understand these instructions.  Will watch your condition.  Will get help right away if you are not doing well or get worse. Document Released: 08/19/2000 Document Revised: 02/25/2013 Document Reviewed: 12/17/2012 [facility] Patient Information 2015 [place], [place]. This information is not intended to replace advice given to you by your health care provider. Make sure you discuss any questions you have with your health care provider. RICE: Routine Care for Injuries The routine care of many injuries includes Rest, Ice, Compression, and Elevation (RICE). HOME CARE INSTRUCTIONS  Rest is needed to allow your body to heal. Routine activities can usually be resumed when comfortable. Injured tendons and bones can take up to 6 weeks to heal. Tendons are the cord-like structures that attach muscle to bone.  Ice following an injury helps keep the swelling down and reduces pain.  Put ice in a plastic bag.  Place a towel between your skin and the bag.  Leave the ice on for 15-20 minutes, 3-4 times a day, or as directed by your health care provider. Do this while awake, for the first 24 to 48 hours. After that, continue as directed by your caregiver.  Compression helps keep swelling down. It also gives support and  helps with discomfort. If an elastic bandage has been applied, it should be removed and reapplied every 3 to 4 hours. It should not be applied tightly, but firmly enough to keep swelling down. Watch fingers or toes for swelling, bluish discoloration, coldness, numbness, or excessive pain. If any of these problems occur, remove the bandage and reapply loosely. Contact your caregiver if these problems continue.  Elevation helps reduce swelling and decreases pain. With extremities, such as the arms, hands, legs, and feet, the injured area should be placed near or above the level of the heart, if possible. SEEK IMMEDIATE MEDICAL CARE IF:  You have persistent pain and swelling.  You develop redness, numbness, or unexpected weakness.  Your symptoms are getting worse rather than improving after several days. These symptoms may indicate that further evaluation or further X-rays are needed. Sometimes, X-rays may not show a small broken bone (fracture) until 1 week or 10 days later. Make a follow-up appointment with your caregiver. Ask when your X-ray results will be ready. Make sure you get your X-ray results. Document Released: 08/19/2000 Document Revised: 05/12/2013 Document Reviewed: 10/06/2010 [facility] Patient Information 2015 [place], [place]. This information is not intended to replace advice given to you by your health care provider. Make sure you discuss any questions you have with your  health care provider. Biceps Tendon Tendinitis (Proximal) and Tenosynovitis with Rehab Tendonitis and tenosynovitis involve inflammation of the tendon and the tendon lining (sheath). The proximal biceps tendon is vulnerable to tendonitis and tenosynovitis, which causes pain and discomfort in the front of the shoulder and upper arm. The tendon lining secretes a fluid that helps lubricate the tendon, allowing for proper function without pain. When the tendon and its lining become inflamed, the tendon can no longer glide  smoothly, causing pain. The proximal biceps tendon connects the biceps muscle to two bones of the shoulder. It is important for proper function of the elbow and turning the palm upward (supination) using the wrist. Proximal biceps tendon tendinitis may include a grade 1 or 2 strain of the tendon. Grade 1 strains involve a slight pull of the tendon without signs of tearing and no observed tendon lengthening. There is also no loss of strength. Grade 2 strains involve small tears in the tendon fibers. The tendon or muscle is stretched and strength is usually decreased.  SYMPTOMS   Pain, tenderness, swelling, warmth, or redness over the front of the shoulder.  Pain that gets worse with shoulder and elbow use, especially against resistance.  Limited motion of the shoulder or elbow.  Crackling sound (crepitation) when the tendon or shoulder is moved or touched. CAUSES  The symptoms of biceps tendonitis are due to inflammation of the tendon. Inflammation may be caused by:  Strain from sudden increase in amount or intensity of activity.  Direct blow or injury to the elbow (uncommon).  Overuse or repetitive elbow bending or wrist rotation, particularly when turning the palm up, or with elbow hyperextension. RISK INCREASES WITH:  Sports that involve contact or overhead arm activity (throwing sports, gymnastics, weightlifting, bodybuilding, rock climbing).  Heavy labor.  Poor strength and flexibility.  Failure to warm up properly before activity. PREVENTION  Warm up and stretch properly before activity.  Allow time for recovery between activities.  Maintain physical fitness:  Strength, flexibility, and endurance.  Cardiovascular fitness.  Learn and use proper exercise technique. PROGNOSIS  With proper treatment, proximal biceps tendon tendonitis and tenosynovitis is usually curable within 6 weeks. Healing is usually quicker if the cause was a direct blow, not overuse.  RELATED  COMPLICATIONS   Longer healing time if not properly treated or if not given enough time to heal.  Chronically inflamed tendon that causes persistent pain with activity, that may progress to constant pain and potentially rupture of the tendon.  Recurring symptoms, especially if activity is resumed too soon or with overuse, a direct blow, or use of poor exercise technique. TREATMENT Treatment first involves ice and medicine, to reduce pain and inflammation. It is helpful to modify activities that cause pain, to reduce the chances of causing the condition to get worse. Strengthening and stretching exercises should be performed to promote proper use of the muscles of the shoulder. These exercises may be performed at home or with a therapist. Other treatments may be given such as ultrasound or heat therapy. A corticosteroid injection may be recommended to help reduce inflammation of the tendon lining. Surgery is usually not necessary. Sometimes, if symptoms last for greater than 6 months, surgery will be advised to detach the tendon and re-insert it into the arm bone. Surgery to correct other shoulder problems that may be contributing to tendinitis may be advised before surgery for the tendinitis itself.  MEDICATION  If pain medicine is needed, nonsteroidal anti-inflammatory medicines (aspirin and ibuprofen), or  other minor pain relievers (acetaminophen), are often advised.  Do not take pain medicine for 7 days before surgery.  Prescription pain relievers may be given if your caregiver thinks they are needed. Use only as directed and only as much as you need.  Corticosteroid injections may be given. These injections should only be used on the most severe cases, as one can only receive a limited number of them. HEAT AND COLD   Cold treatment (icing) should be applied for 10 to 15 minutes every 2 to 3 hours for inflammation and pain, and immediately after activity that aggravates your symptoms. Use ice  packs or an ice massage.  Heat treatment may be used before performing stretching and strengthening activities prescribed by your caregiver, physical therapist, or athletic trainer. Use a heat pack or a warm water soak. SEEK MEDICAL CARE IF:   Symptoms get worse or do not improve in 2 weeks, despite treatment.  New, unexplained symptoms develop. (Drugs used in treatment may produce side effects.) EXERCISES RANGE OF MOTION (ROM) AND EXERCISES - Biceps Tendon (Proximal) and Tenosynovitis These exercises may help you when beginning to rehabilitate your injury. Your symptoms may go away with or without further involvement from your physician, physical therapist, or athletic trainer. While completing these exercises, remember:   Restoring tissue flexibility helps normal motion to return to the joints. This allows healthier, less painful movement and activity.  An effective stretch should be held for at least 30 seconds.  A stretch should never be painful. You should only feel a gentle lengthening or release in the stretched tissue. STRETCH - Flexion, Standing  Stand with good posture. With an underhand grip on your right / left hand and an overhand grip on the opposite hand, grasp a broomstick or cane so that your hands are a little more than shoulder width apart.  Keeping your right / left elbow straight and shoulder muscles relaxed, push the stick with your opposite hand to raise your right / left arm in front of your body and then overhead. Raise your arm until you feel a stretch in your right / left shoulder, but before you have increased shoulder pain.  Try to avoid shrugging your right / left shoulder as your arm rises, by keeping your shoulder blade tucked down and toward your mid-back spine. Hold for __________ seconds.  Slowly return to the starting position. Repeat __________ times. Complete this exercise __________ times per day. STRETCH - Abduction, Supine  Lie on your back. With  an underhand grip on your right / left hand and an overhand grip on the opposite hand, grasp a broomstick or cane so that your hands are a little more than shoulder width apart.  Keeping your right / left elbow straight and shoulder muscles relaxed, push the stick with your opposite hand to raise your right / left arm out to the side of your body and then overhead. Raise your arm until you feel a stretch in your right / left shoulder, but before you have increased shoulder pain.  Try to avoid shrugging your right / left shoulder as your arm rises, by keeping your shoulder blade tucked down and toward your mid-back spine. Hold for __________ seconds.  Slowly return to the starting position. Repeat __________ times. Complete this exercise __________ times per day. ROM - Flexion, Active-Assisted  Lie on your back. You may bend your knees for comfort.  Grasp a broomstick or cane so your hands are about shoulder width apart. Your right /  left hand should grip the end of the stick so that your hand is positioned "thumbs-up," as if you were about to shake hands.  Using your healthy arm to lead, raise your right / left arm overhead until you feel a gentle stretch in your shoulder. Hold for __________ seconds.  Use the stick to assist in returning your right / left arm to its starting position. Repeat __________ times. Complete this exercise __________ times per day.  STRETCH - Flexion, Standing   Stand facing a wall. Walk your right / left fingers up the wall until you feel a moderate stretch in your shoulder. As your hand gets higher, you may need to step closer to the wall or use a door frame to walk through.  Try to avoid shrugging your right / left shoulder as your arm rises, by keeping your shoulder blade tucked down and toward your mid-back spine.  Hold for __________ seconds. Use your other hand, if needed, to ease out of the stretch and return to the starting position. Repeat __________  times. Complete this exercise __________ times per day.  ROM - Internal Rotation   Using underhand grips, grasp a stick behind your back with both hands.  While standing upright with good posture, slide the stick up your back until you feel a mild stretch in the front of your shoulder.  Hold for __________ seconds. Slowly return to your starting position. Repeat __________ times. Complete this exercise __________ times per day.  STRETCH - Internal Rotation  Place your right / left hand behind your back, palm-up.  Throw a towel or belt over your opposite shoulder. Grasp the towel with your right / left hand.  While keeping an upright posture, gently pull up on the towel until you feel a stretch in the front of your right / left shoulder.  Avoid shrugging your right / left shoulder as your arm rises, by keeping your shoulder blade tucked down and toward your mid-back spine.  Hold for __________ seconds. Release the stretch by lowering your opposite hand. Repeat __________ times. Complete this exercise __________ times per day. STRENGTHENING EXERCISES - Biceps Tendon Tendinitis (Proximal) and Tenosynovitis These exercises may help you regain your strength after your physician has discontinued your restraint in a cast or brace. They may resolve your symptoms with or without further involvement from your physician, physical therapist or athletic trainer. While completing these exercises, remember:   Muscles can gain both the endurance and the strength needed for everyday activities through controlled exercises.  Complete these exercises as instructed by your physician, physical therapist or athletic trainer. Increase the resistance and repetitions only as guided.  You may experience muscle soreness or fatigue, but the pain or discomfort you are trying to eliminate should never worsen during these exercises. If this pain does get worse, stop and make sure you are following the directions  exactly. If the pain is still present after adjustments, discontinue the exercise until you can discuss the trouble with your caregiver. STRENGTH - Elbow Flexors, Isometric  Stand or sit upright on a firm surface. Place your right / left arm so that your hand is palm-up and at the height of your waist.  Place your opposite hand on top of your forearm. Gently push down as your right / left arm resists. Push as hard as you can with both arms, without causing any pain or movement at your right / left elbow. Hold this stationary position for __________ seconds.  Gradually release the  tension in both arms. Allow your muscles to relax completely before repeating. Repeat __________ times. Complete this exercise __________ times per day. STRENGTH - Shoulder Flexion, Isometric  With good posture and facing a wall, stand or sit about 4-6 inches away.  Keeping your right / left elbow straight, gently press the top of your fist into the wall. Increase the pressure gradually until you are pressing as hard as you can, without shrugging your shoulder or increasing any shoulder discomfort.  Hold for __________ seconds.  Release the tension slowly. Relax your shoulder muscles completely before you start the next repetition. Repeat __________ times. Complete this exercise __________ times per day.  STRENGTH - Elbow Flexors, Supinated  With good posture, stand or sit on a firm chair without armrests. Allow your right / left arm to rest at your side with your palm facing forward.  Holding a __________ weight, or gripping a rubber exercise band or tubing,  bring your hand toward your shoulder.  Allow your muscles to control the resistance as your hand returns to your side. Repeat __________ times. Complete this exercise __________ times per day.  STRENGTH - Shoulder Flexion  Stand or sit with good posture. Grasp a __________ weight, or an exercise band or tubing, so that your hand is "thumbs-up," like when  you shake hands.  Slowly lift your right / left arm as far as you can, without increasing any shoulder pain. At first, many people can only raise their hand to shoulder height.  Avoid shrugging your right / left shoulder as your arm rises, by keeping your shoulder blade tucked down and toward your mid-back spine.  Hold for __________ seconds. Control the descent of your hand as you slowly return to your starting position. Repeat __________ times. Complete this exercise __________ times per day. Document Released: 05/07/2005 Document Revised: 07/30/2011 Document Reviewed: 08/19/2008 [facility] Patient Information 2015 [place]. This information is not intended to replace advice given to you by your health care provider. Make sure you discuss any questions you have with your health care provider.

## 2014-08-24 ENCOUNTER — Encounter (HOSPITAL_COMMUNITY): Payer: Self-pay

## 2014-08-24 ENCOUNTER — Emergency Department (HOSPITAL_COMMUNITY)
Admission: EM | Admit: 2014-08-24 | Discharge: 2014-08-24 | Disposition: A | Discharge status: Home / Self Care | Payer: Self-pay | Attending: Emergency Medicine | Admitting: Emergency Medicine

## 2014-08-24 DIAGNOSIS — H6091 Unspecified otitis externa, right ear: Secondary | ICD-10-CM | POA: Insufficient documentation

## 2014-08-24 DIAGNOSIS — H9201 Otalgia, right ear: Secondary | ICD-10-CM

## 2014-08-24 DIAGNOSIS — Z720 Tobacco use: Secondary | ICD-10-CM | POA: Insufficient documentation

## 2014-08-24 DIAGNOSIS — J302 Other seasonal allergic rhinitis: Secondary | ICD-10-CM | POA: Insufficient documentation

## 2014-08-24 MED ORDER — NEOMYCIN-POLYMYXIN-HC 3.5-10000-1 OT SUSP: 4.0000 [drp] | Freq: Four times a day (QID) | OTIC | Status: AC

## 2014-08-24 MED ORDER — CETIRIZINE HCL 10 MG PO TABS: 10.0000 mg | ORAL_TABLET | Freq: Every day | ORAL | Status: AC

## 2014-08-24 MED ORDER — NAPROXEN 500 MG PO TABS: 500.0000 mg | ORAL_TABLET | Freq: Two times a day (BID) | ORAL | Status: AC

## 2014-08-24 NOTE — ED Provider Notes (Signed)
CSN: [id number]     Arrival date & time 08/24/14  1023 History   First MD Initiated Contact with Patient 08/24/14 1127     Chief Complaint  Patient presents with  . Otalgia   William Vasquez is a 29 y.o. male who is otherwise healthy who presents to the ED complaining of right ear pain for the past 4 days. He complains of 6/10 ear pain. He has taken nothing for treatment today. He reports using ear pain drops from the drug store and placing cotton in his ear. He reports seeing blood on the end of a q-tip yesterday.  He also reports associated nasal congestion, and sneezing. He denies hearing loss, fevers, sore throat, trouble swallowing, cough or recent ear infections. He denies recent changes in altitude.   (Consider location/radiation/quality/duration/timing/severity/associated sxs/prior Treatment) HPI  History reviewed. No pertinent past medical history. History reviewed. No pertinent past surgical history. History reviewed. No pertinent family history. History  Substance Use Topics  . Smoking status: Current Every Day Smoker  . Smokeless tobacco: Not on file  . Alcohol Use: Yes    Review of Systems  Constitutional: Negative for fever.  HENT: Positive for ear pain. Negative for ear discharge.   Eyes: Negative for pain and visual disturbance.  Skin: Negative for rash.      Allergies  Review of patient's allergies indicates no known allergies.  Home Medications   Prior to Admission medications   Medication Sig Start Date End Date Taking? Authorizing Provider  cetirizine (ZYRTEC ALLERGY) 10 MG tablet Take 1 tablet (10 mg total) by mouth daily. 08/24/14   Everlene Farrier, PA-C  naproxen (NAPROSYN) 500 MG tablet Take 1 tablet (500 mg total) by mouth 2 (two) times daily with a meal. 08/24/14   Everlene Farrier, PA-C  neomycin-polymyxin-hydrocortisone (CORTISPORIN) 3.5-10000-1 otic suspension Place 4 drops into the right ear 4 (four) times daily. 08/24/14   Everlene Farrier, PA-C   sulfamethoxazole-trimethoprim (SEPTRA DS) 800-160 MG per tablet Take 1 tablet by mouth every 12 (twelve) hours. 03/17/14   Arthor Captain, PA-C  traMADol (ULTRAM) 50 MG tablet Take 1 tablet (50 mg total) by mouth every 6 (six) hours as needed. 03/17/14   Abigail Harris, PA-C   BP 135/76 mmHg  Pulse 70  Temp(Src) 98.4 F (36.9 C) (Oral)  Resp 16  SpO2 96% Physical Exam  Constitutional: He is oriented to person, place, and time. He appears well-developed and well-nourished. No distress.  HENT:  Head: Normocephalic and atraumatic.  Right Ear: External ear normal.  Left Ear: External ear normal.  Nose: Nose normal.  Mouth/Throat: Oropharynx is clear and moist. No oropharyngeal exudate.  Bilateral tympanic membranes are pearly-gray without erythema or loss of landmarks. No TM perforation. Some mild right external auditory canal swelling and discharge. Patient removed tissue from ear prior to exam. No blood in ears. No tonsillar hypertrophy. Mild tenderness with manipulation of his right pinna. No mastoid tenderness.   Eyes: Conjunctivae are normal. Pupils are equal, round, and reactive to light. Right eye exhibits no discharge. Left eye exhibits no discharge.  Neck: Normal range of motion. Neck supple. No JVD present. No tracheal deviation present.  No cervical lymphadenopathy.   Cardiovascular: Normal rate, regular rhythm and normal heart sounds.   Pulmonary/Chest: Effort normal. No respiratory distress. He has no wheezes. He has no rales.  Abdominal: Soft. There is no tenderness.  Lymphadenopathy:    He has no cervical adenopathy.  Neurological: He is alert and oriented to person, place, and  time. No cranial nerve deficit. Coordination normal.  Skin: Skin is warm and dry. No rash noted. He is not diaphoretic. No erythema. No pallor.  Psychiatric: He has a normal mood and affect. His behavior is normal.  Nursing note and vitals reviewed.   ED Course  Procedures (including critical care  time) Labs Review Labs Reviewed - No data to display  Imaging Review No results found.   EKG Interpretation None      Filed Vitals:   08/24/14 1120 08/24/14 1202  BP: 135/76   Pulse: 70 70  Temp: 98.4 F (36.9 C)   TempSrc: Oral   Resp: 16   SpO2: 100% 96%     MDM   Meds given in ED:  Medications - No data to display  New Prescriptions   CETIRIZINE (ZYRTEC ALLERGY) 10 MG TABLET    Take 1 tablet (10 mg total) by mouth daily.   NEOMYCIN-POLYMYXIN-HYDROCORTISONE (CORTISPORIN) 3.5-10000-1 OTIC SUSPENSION    Place 4 drops into the right ear 4 (four) times daily.    Final diagnoses:  Ear ache, right  External otitis of right ear  Seasonal allergies   This is a 29 y.o. male who is otherwise healthy who presents to the ED complaining of right ear pain for the past 4 days. He complains of 6/10 ear pain. He has taken nothing for treatment today. He reports using ear pain drops from the drug store and placing cotton in his ear. The patient is afebrile and nontoxic appearing. The patient's bilateral TMs are intact. Hearing is grossly intact. There is a mild amount of external auditory canal edema with a mild amount of discharge. No mastoid tenderness. Mild tenderness with manipulation of his pinna. Will treat for otitis externa, likely caused by him placing cotton and Q-tips into his ear. Will treat for his allergy symptoms with Zyrtec and pain with Naprosyn. I advised the patient to follow-up with their primary care provider this week. I advised the patient to return to the emergency department with new or worsening symptoms or new concerns. The patient verbalized understanding and agreement with plan.   This patient was discussed with Dr. Jodi MourningZavitz who agrees with assessment and plan.     [name], PA-C 08/24/14 1208  Blane OharaJoshua Zavitz, MD 08/28/14 [phone]

## 2014-08-24 NOTE — Discharge Instructions (Signed)
Otitis Externa Otitis externa is a bacterial or fungal infection of the outer ear canal. This is the area from the eardrum to the outside of the ear. Otitis externa is sometimes called "swimmer's ear." CAUSES  Possible causes of infection include:  Swimming in dirty water.  Moisture remaining in the ear after swimming or bathing.  Mild injury (trauma) to the ear.  Objects stuck in the ear (foreign body).  Cuts or scrapes (abrasions) on the outside of the ear. SIGNS AND SYMPTOMS  The first symptom of infection is often itching in the ear canal. Later signs and symptoms may include swelling and redness of the ear canal, ear pain, and yellowish-white fluid (pus) coming from the ear. The ear pain may be worse when pulling on the earlobe. DIAGNOSIS  Your health care provider will perform a physical exam. A sample of fluid may be taken from the ear and examined for bacteria or fungi. TREATMENT  Antibiotic ear drops are often given for 10 to 14 days. Treatment may also include pain medicine or corticosteroids to reduce itching and swelling. HOME CARE INSTRUCTIONS   Apply antibiotic ear drops to the ear canal as prescribed by your health care provider.  Take medicines only as directed by your health care provider.  If you have diabetes, follow any additional treatment instructions from your health care provider.  Keep all follow-up visits as directed by your health care provider. PREVENTION   Keep your ear dry. Use the corner of a towel to absorb water out of the ear canal after swimming or bathing.  Avoid scratching or putting objects inside your ear. This can damage the ear canal or remove the protective wax that lines the canal. This makes it easier for bacteria and fungi to grow.  Avoid swimming in lakes, polluted water, or poorly chlorinated pools.  You may use ear drops made of rubbing alcohol and vinegar after swimming. Combine equal parts of white vinegar and alcohol in a bottle.  Put 3 or 4 drops into each ear after swimming. SEEK MEDICAL CARE IF:   You have a fever.  Your ear is still red, swollen, painful, or draining pus after 3 days.  Your redness, swelling, or pain gets worse.  You have a severe headache.  You have redness, swelling, pain, or tenderness in the area behind your ear. MAKE SURE YOU:   Understand these instructions.  Will watch your condition.  Will get help right away if you are not doing well or get worse. Document Released: 05/07/2005 Document Revised: 09/21/2013 Document Reviewed: 05/24/2011 [facility] Patient Information 2015 [place], [place]. This information is not intended to replace advice given to you by your health care provider. Make sure you discuss any questions you have with your health care provider.  Allergies Allergies may happen from anything your body is sensitive to. This may be food, medicines, pollens, chemicals, and nearly anything around you in everyday life that produces allergens. An allergen is anything that causes an allergy producing substance. Heredity is often a factor in causing these problems. This means you may have some of the same allergies as your parents. Food allergies happen in all age groups. Food allergies are some of the most severe and life threatening. Some common food allergies are cow's milk, seafood, eggs, nuts, wheat, and soybeans. SYMPTOMS   Swelling around the mouth.  An itchy red rash or hives.  Vomiting or diarrhea.  Difficulty breathing. SEVERE ALLERGIC REACTIONS ARE LIFE-THREATENING. This reaction is called anaphylaxis. It can  cause the mouth and throat to swell and cause difficulty with breathing and swallowing. In severe reactions only a trace amount of food (for example, peanut oil in a salad) may cause death within seconds. Seasonal allergies occur in all age groups. These are seasonal because they usually occur during the same season every year. They may be a reaction to molds,  grass pollens, or tree pollens. Other causes of problems are house dust mite allergens, pet dander, and mold spores. The symptoms often consist of nasal congestion, a runny itchy nose associated with sneezing, and tearing itchy eyes. There is often an associated itching of the mouth and ears. The problems happen when you come in contact with pollens and other allergens. Allergens are the particles in the air that the body reacts to with an allergic reaction. This causes you to release allergic antibodies. Through a chain of events, these eventually cause you to release histamine into the blood stream. Although it is meant to be protective to the body, it is this release that causes your discomfort. This is why you were given anti-histamines to feel better. If you are unable to pinpoint the offending allergen, it may be determined by skin or blood testing. Allergies cannot be cured but can be controlled with medicine. Hay fever is a collection of all or some of the seasonal allergy problems. It may often be treated with simple over-the-counter medicine such as diphenhydramine. Take medicine as directed. Do not drink alcohol or drive while taking this medicine. Check with your caregiver or package insert for child dosages. If these medicines are not effective, there are many new medicines your caregiver can prescribe. Stronger medicine such as nasal spray, eye drops, and corticosteroids may be used if the first things you try do not work well. Other treatments such as immunotherapy or desensitizing injections can be used if all else fails. Follow up with your caregiver if problems continue. These seasonal allergies are usually not life threatening. They are generally more of a nuisance that can often be handled using medicine. HOME CARE INSTRUCTIONS   If unsure what causes a reaction, keep a diary of foods eaten and symptoms that follow. Avoid foods that cause reactions.  If hives or rash are present:  Take  medicine as directed.  You may use an over-the-counter antihistamine (diphenhydramine) for hives and itching as needed.  Apply cold compresses (cloths) to the skin or take baths in cool water. Avoid hot baths or showers. Heat will make a rash and itching worse.  If you are severely allergic:  Following a treatment for a severe reaction, hospitalization is often required for closer follow-up.  Wear a medic-alert bracelet or necklace stating the allergy.  You and your family must learn how to give adrenaline or use an anaphylaxis kit.  If you have had a severe reaction, always carry your anaphylaxis kit or EpiPen with you. Use this medicine as directed by your caregiver if a severe reaction is occurring. Failure to do so could have a fatal outcome. SEEK MEDICAL CARE IF:  You suspect a food allergy. Symptoms generally happen within 30 minutes of eating a food.  Your symptoms have not gone away within 2 days or are getting worse.  You develop new symptoms.  You want to retest yourself or your child with a food or drink you think causes an allergic reaction. Never do this if an anaphylactic reaction to that food or drink has happened before. Only do this under the care of  a caregiver. SEEK IMMEDIATE MEDICAL CARE IF:   You have difficulty breathing, are wheezing, or have a tight feeling in your chest or throat.  You have a swollen mouth, or you have hives, swelling, or itching all over your body.  You have had a severe reaction that has responded to your anaphylaxis kit or an EpiPen. These reactions may return when the medicine has worn off. These reactions should be considered life threatening. MAKE SURE YOU:   Understand these instructions.  Will watch your condition.  Will get help right away if you are not doing well or get worse. Document Released: 07/31/2002 Document Revised: 09/01/2012 Document Reviewed: 01/05/2008  [facility] Patient Information 2015 [place], [place]. This  information is not intended to replace advice given to you by your health care provider. Make sure you discuss any questions you have with your health care provider.

## 2014-08-24 NOTE — ED Notes (Signed)
Earache since Saturday.  No fever.  Right side

## 2015-06-23 IMAGING — CR DG CHEST 2V
2 series · 2 of 2 positions shown · non-contrast
Comparison: None.

CLINICAL DATA: Syncopal episode.

EXAM:
CHEST  2 VIEW

[w chest pa]
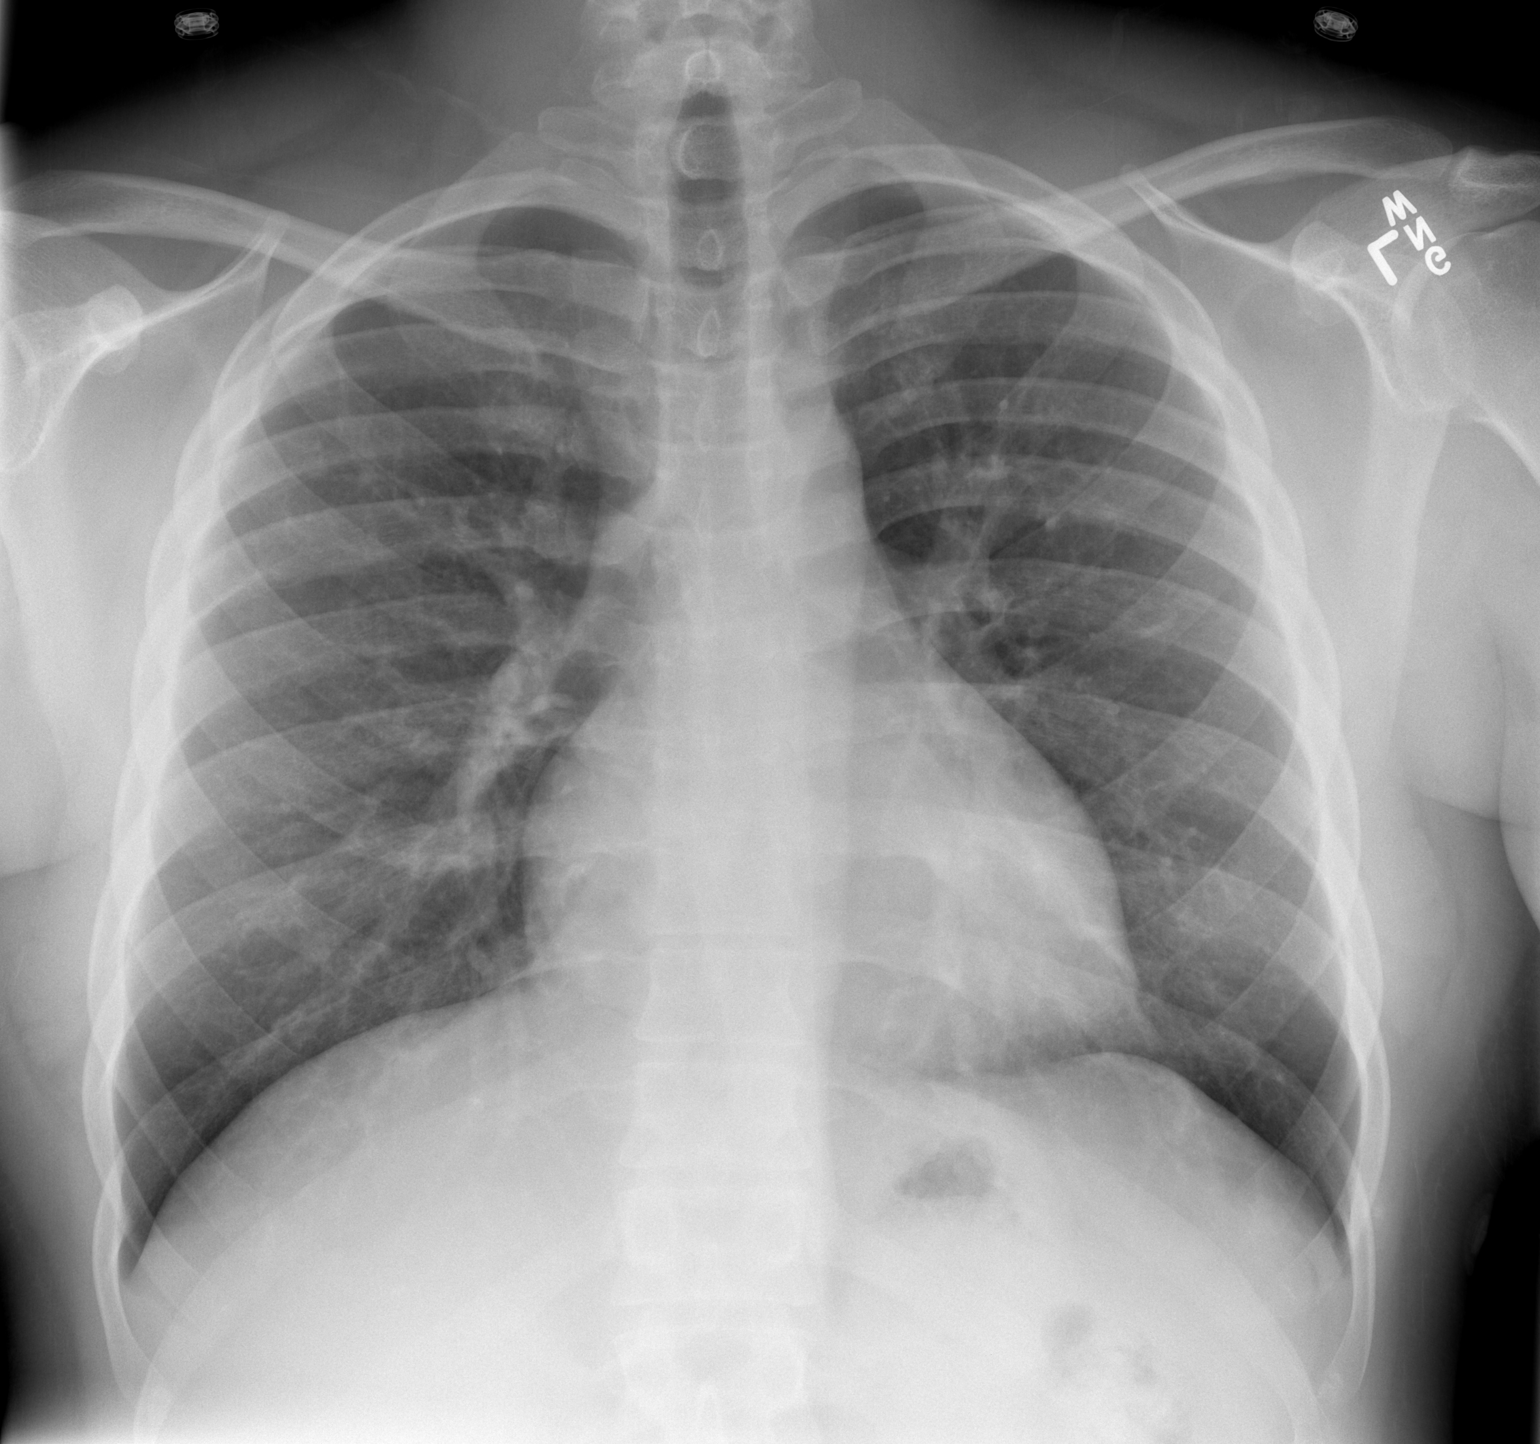

[w chest lat]
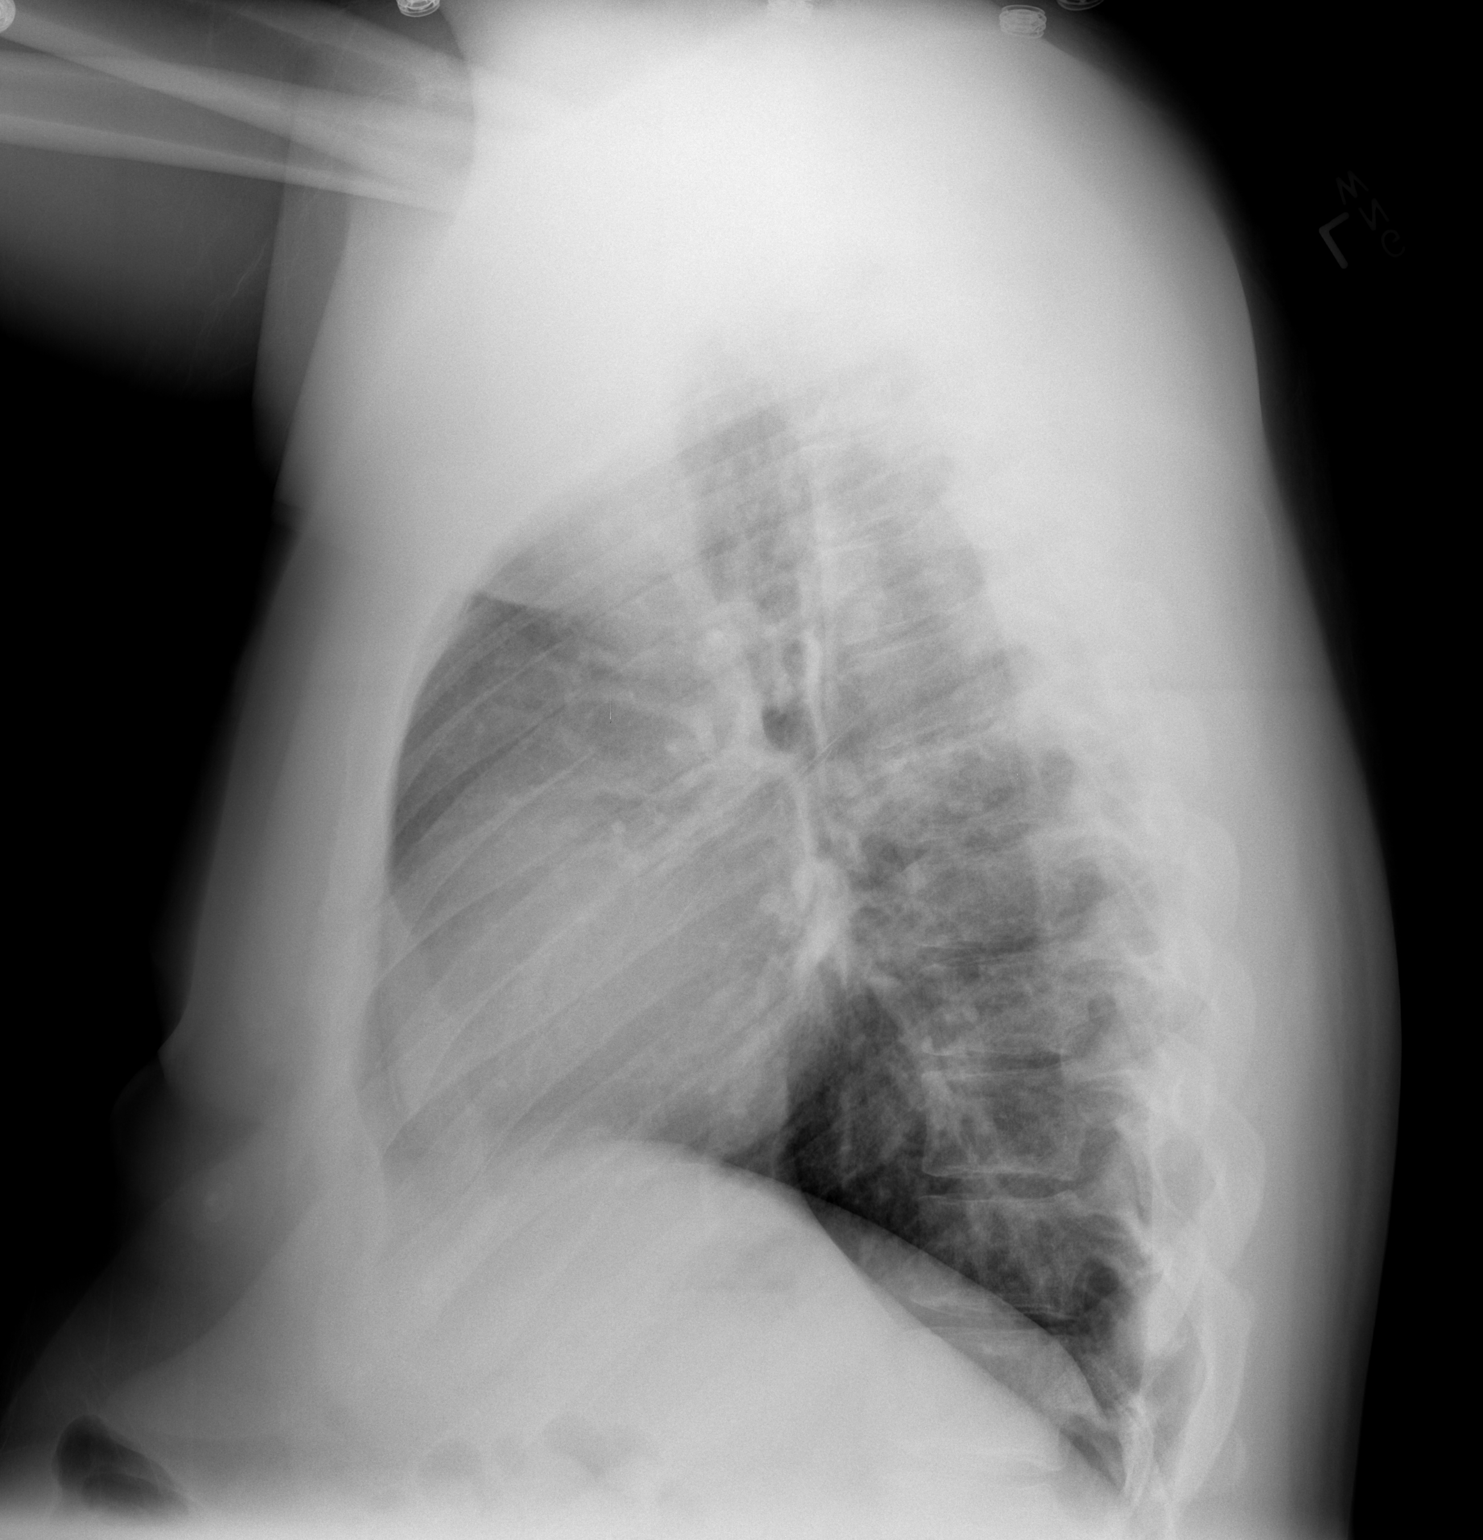

[2 of 2 positions shown; findings below may reference images not displayed]

FINDINGS: The heart size and mediastinal contours are within normal limits.
Both lungs are clear. The visualized skeletal structures are
unremarkable.
IMPRESSION: No active cardiopulmonary disease.

## 2016-10-03 ENCOUNTER — Encounter (HOSPITAL_COMMUNITY): Payer: Self-pay | Admitting: Emergency Medicine

## 2016-10-03 ENCOUNTER — Emergency Department (HOSPITAL_COMMUNITY)
Admission: EM | Admit: 2016-10-03 | Discharge: 2016-10-03 | Disposition: A | Discharge status: Home / Self Care | Payer: Self-pay | Attending: Emergency Medicine | Admitting: Emergency Medicine

## 2016-10-03 DIAGNOSIS — X58XXXA Exposure to other specified factors, initial encounter: Secondary | ICD-10-CM | POA: Insufficient documentation

## 2016-10-03 DIAGNOSIS — Z79899 Other long term (current) drug therapy: Secondary | ICD-10-CM | POA: Insufficient documentation

## 2016-10-03 DIAGNOSIS — Y929 Unspecified place or not applicable: Secondary | ICD-10-CM | POA: Insufficient documentation

## 2016-10-03 DIAGNOSIS — Y939 Activity, unspecified: Secondary | ICD-10-CM | POA: Insufficient documentation

## 2016-10-03 DIAGNOSIS — F17200 Nicotine dependence, unspecified, uncomplicated: Secondary | ICD-10-CM | POA: Insufficient documentation

## 2016-10-03 DIAGNOSIS — Y999 Unspecified external cause status: Secondary | ICD-10-CM | POA: Insufficient documentation

## 2016-10-03 DIAGNOSIS — S39012A Strain of muscle, fascia and tendon of lower back, initial encounter: Secondary | ICD-10-CM | POA: Insufficient documentation

## 2016-10-03 MED ORDER — NAPROXEN 500 MG PO TABS: 500.0000 mg | ORAL_TABLET | Freq: Two times a day (BID) | ORAL | 0 refills | Status: AC

## 2016-10-03 MED ORDER — METHOCARBAMOL 750 MG PO TABS: 750.0000 mg | ORAL_TABLET | Freq: Four times a day (QID) | ORAL | 0 refills | Status: AC

## 2016-10-03 NOTE — ED Provider Notes (Signed)
[facility]-EMERGENCY DEPT Provider Note   CSN: [id number] Arrival date & time: 10/03/16  1191     History   Chief Complaint Chief Complaint  Patient presents with  . Back Pain    lower back pain    HPI Val Gingerich is a 31 y.o. male.  31 year old male presents with 5 days of right-sided lower back pain characterizes dull and worse with movement. Pain does not radiate down to his right leg. No foot drop. No bowel or bladder dysfunction. Denies any rashes. No hematuria or dysuria. Pain better with remaining still and temporarily relieved with over-the-counter medications. Denies any prior history of trauma or surgery.      History reviewed. No pertinent past medical history.  There are no active problems to display for this patient.   History reviewed. No pertinent surgical history.     Home Medications    Prior to Admission medications   Medication Sig Start Date End Date Taking? Authorizing Provider  cetirizine (ZYRTEC ALLERGY) 10 MG tablet Take 1 tablet (10 mg total) by mouth daily. 08/24/14   Everlene Farrier, PA-C  methocarbamol (ROBAXIN-750) 750 MG tablet Take 1 tablet (750 mg total) by mouth 4 (four) times daily. 10/03/16   Lorre Nick, MD  naproxen (NAPROSYN) 500 MG tablet Take 1 tablet (500 mg total) by mouth 2 (two) times daily with a meal. 08/24/14   Everlene Farrier, PA-C  naproxen (NAPROSYN) 500 MG tablet Take 1 tablet (500 mg total) by mouth 2 (two) times daily. 10/03/16   Lorre Nick, MD  neomycin-polymyxin-hydrocortisone (CORTISPORIN) 3.5-10000-1 otic suspension Place 4 drops into the right ear 4 (four) times daily. 08/24/14   Everlene Farrier, PA-C  sulfamethoxazole-trimethoprim (SEPTRA DS) 800-160 MG per tablet Take 1 tablet by mouth every 12 (twelve) hours. 03/17/14   Arthor Captain, PA-C  traMADol (ULTRAM) 50 MG tablet Take 1 tablet (50 mg total) by mouth every 6 (six) hours as needed. 03/17/14   Arthor Captain, PA-C    Family History History reviewed. No  pertinent family history.  Social History Social History  Substance Use Topics  . Smoking status: Current Some Day Smoker  . Smokeless tobacco: Never Used  . Alcohol use Yes     Allergies   Patient has no known allergies.   Review of Systems Review of Systems  All other systems reviewed and are negative.    Physical Exam Updated Vital Signs BP (!) 137/94 (BP Location: Left Arm)   Pulse 95   Temp 97.8 F (36.6 C) (Oral)   Resp 16   SpO2 100%   Physical Exam  Constitutional: He is oriented to person, place, and time. He appears well-developed and well-nourished.  Non-toxic appearance. No distress.  HENT:  Head: Normocephalic and atraumatic.  Eyes: Conjunctivae, EOM and lids are normal. Pupils are equal, round, and reactive to light.  Neck: Normal range of motion. Neck supple. No tracheal deviation present. No thyroid mass present.  Cardiovascular: Normal rate, regular rhythm and normal heart sounds.  Exam reveals no gallop.   No murmur heard. Pulmonary/Chest: Effort normal and breath sounds normal. No stridor. No respiratory distress. He has no decreased breath sounds. He has no wheezes. He has no rhonchi. He has no rales.  Abdominal: Soft. Normal appearance and bowel sounds are normal. He exhibits no distension. There is no tenderness. There is no rebound and no CVA tenderness.  Musculoskeletal: Normal range of motion. He exhibits no edema or tenderness.       Back:  Neurological: He  is alert and oriented to person, place, and time. He has normal strength. No cranial nerve deficit or sensory deficit. GCS eye subscore is 4. GCS verbal subscore is 5. GCS motor subscore is 6.  Skin: Skin is warm and dry. No abrasion and no rash noted.  Psychiatric: He has a normal mood and affect. His speech is normal and behavior is normal.  Nursing note and vitals reviewed.    ED Treatments / Results  Labs (all labs ordered are listed, but only abnormal results are displayed) Labs  Reviewed - No data to display  EKG  EKG Interpretation None       Radiology No results found.  Procedures Procedures (including critical care time)  Medications Ordered in ED Medications - No data to display   Initial Impression / Assessment and Plan / ED Course  I have reviewed the triage vital signs and the nursing notes.  Pertinent labs & imaging results that were available during my care of the patient were reviewed by me and considered in my medical decision making (see chart for details).     Patient muscle skeletal back pain here. Will be given anti-inflammatories and muscle relaxants and return precautions given.  Final Clinical Impressions(s) / ED Diagnoses   Final diagnoses:  Strain of lumbar region, initial encounter    New Prescriptions New Prescriptions   METHOCARBAMOL (ROBAXIN-750) 750 MG TABLET    Take 1 tablet (750 mg total) by mouth 4 (four) times daily.   NAPROXEN (NAPROSYN) 500 MG TABLET    Take 1 tablet (500 mg total) by mouth 2 (two) times daily.     Lorre NickAllen, Chantavia Bazzle, MD 10/03/16 1001

## 2016-10-03 NOTE — ED Triage Notes (Signed)
Pt c/o lower back pain x 5 days. Pt denies injury. Pt has been taking OTC pain meds which provide mild relief. Denies numbness / tingling in lower extremities.

## 2016-10-03 NOTE — ED Notes (Signed)
Patient name was never discharged out of system but given discharge instructions from previous RN.

## 2017-07-16 ENCOUNTER — Encounter (HOSPITAL_COMMUNITY): Payer: Self-pay

## 2017-07-16 ENCOUNTER — Emergency Department (HOSPITAL_COMMUNITY): Payer: Self-pay

## 2017-07-16 ENCOUNTER — Other Ambulatory Visit: Payer: Self-pay

## 2017-07-16 ENCOUNTER — Emergency Department (HOSPITAL_COMMUNITY)
Admission: EM | Admit: 2017-07-16 | Discharge: 2017-07-16 | Disposition: A | Discharge status: Home / Self Care | Payer: Self-pay | Attending: Emergency Medicine | Admitting: Emergency Medicine

## 2017-07-16 DIAGNOSIS — Z79899 Other long term (current) drug therapy: Secondary | ICD-10-CM | POA: Insufficient documentation

## 2017-07-16 DIAGNOSIS — J111 Influenza due to unidentified influenza virus with other respiratory manifestations: Secondary | ICD-10-CM | POA: Insufficient documentation

## 2017-07-16 DIAGNOSIS — F17200 Nicotine dependence, unspecified, uncomplicated: Secondary | ICD-10-CM | POA: Insufficient documentation

## 2017-07-16 DIAGNOSIS — L0100 Impetigo, unspecified: Secondary | ICD-10-CM | POA: Insufficient documentation

## 2017-07-16 MED ORDER — ONDANSETRON 4 MG PO TBDP: ORAL_TABLET | ORAL | 0 refills | Status: AC

## 2017-07-16 MED ORDER — OSELTAMIVIR PHOSPHATE 75 MG PO CAPS: 75.0000 mg | ORAL_CAPSULE | Freq: Two times a day (BID) | ORAL | 0 refills | Status: AC

## 2017-07-16 MED ORDER — MUPIROCIN CALCIUM 2 % EX CREA: 1.0000 "application " | TOPICAL_CREAM | Freq: Two times a day (BID) | CUTANEOUS | 0 refills | Status: AC

## 2017-07-16 NOTE — ED Notes (Signed)
See provider assessment 

## 2017-07-16 NOTE — ED Provider Notes (Signed)
[facility] EMERGENCY DEPARTMENT Provider Note   CSN: [id number] Arrival date & time: 07/16/17  1207     History   Chief Complaint Chief Complaint  Patient presents with  . Cough  . Diarrhea  . Recurrent Skin Infections    HPI William Vasquez is a 32 y.o. male.  William Vasquez is a 32 y.o. Male who is otherwise healthy, presents to the ED for evaluation of generalized body aches subjective fevers and chills, headache, cough, sore throat and rhinorrhea.  The symptoms started suddenly last night, symptoms have been constant and not improving since onset.  Patient reports cough occasionally productive of mucus.  Patient has not tried any medications to treat his symptoms, denies any other aggravating or alleviating factors.  He does report some diarrhea and mild nausea no episodes of vomiting or abdominal pain.  Patient unsure if he has been exposed to the flu but does report he did not have his flu shot this year.  Patient underwent evaluation of multiple "scab-like lesions" on his lower extremities.  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  Patient has not followed up with anyone outpatient regarding this issue.  Patient denies any fevers or chills, surrounding redness or drainage from the lesions.      History reviewed. No pertinent past medical history.  There are no active problems to display for this patient.   History reviewed. No pertinent surgical history.     Home Medications    Prior to Admission medications   Medication Sig Start Date End Date Taking? Authorizing Provider  cetirizine (ZYRTEC ALLERGY) 10 MG tablet Take 1 tablet (10 mg total) by mouth daily. 08/24/14   Everlene Farrier, PA-C  methocarbamol (ROBAXIN-750) 750 MG tablet Take 1 tablet (750 mg total) by mouth 4 (four) times daily.  10/03/16   Lorre Nick, MD  mupirocin cream (BACTROBAN) 2 % Apply 1 application topically 2 (two) times daily. 07/16/17   Dartha Lodge, PA-C  naproxen (NAPROSYN) 500 MG tablet Take 1 tablet (500 mg total) by mouth 2 (two) times daily with a meal. 08/24/14   Everlene Farrier, PA-C  naproxen (NAPROSYN) 500 MG tablet Take 1 tablet (500 mg total) by mouth 2 (two) times daily. 10/03/16   Lorre Nick, MD  neomycin-polymyxin-hydrocortisone (CORTISPORIN) 3.5-10000-1 otic suspension Place 4 drops into the right ear 4 (four) times daily. 08/24/14   Everlene Farrier, PA-C  ondansetron (ZOFRAN ODT) 4 MG disintegrating tablet 4mg  ODT q4 hours prn nausea/vomit 07/16/17   Dartha Lodge, PA-C  oseltamivir (TAMIFLU) 75 MG capsule Take 1 capsule (75 mg total) by mouth every 12 (twelve) hours. 07/16/17   Dartha Lodge, PA-C  sulfamethoxazole-trimethoprim (SEPTRA DS) 800-160 MG per tablet Take 1 tablet by mouth every 12 (twelve) hours. 03/17/14   Arthor Captain, PA-C  traMADol (ULTRAM) 50 MG tablet Take 1 tablet (50 mg total) by mouth every 6 (six) hours as needed. 03/17/14   Arthor Captain, PA-C    Family History No family history on file.  Social History Social History   Tobacco Use  . Smoking status: Current Some Day Smoker  . Smokeless tobacco: Never Used  Substance Use Topics  . Alcohol use: Yes  . Drug use: No     Allergies   Patient has no known allergies.   Review of  Systems Review of Systems  Constitutional: Positive for chills, fatigue and fever.  HENT: Positive for congestion, postnasal drip, rhinorrhea and sore throat. Negative for ear discharge, ear pain and trouble swallowing.   Respiratory: Positive for cough. Negative for chest tightness, shortness of breath and wheezing.   Cardiovascular: Negative for chest pain.  Gastrointestinal: Positive for diarrhea and nausea. Negative for abdominal pain, blood in stool and vomiting.  Genitourinary: Negative for dysuria.  Musculoskeletal:  Positive for arthralgias and myalgias. Negative for neck pain and neck stiffness.  Skin: Negative for color change and rash.  Neurological: Negative for dizziness, light-headedness and headaches.     Physical Exam Updated Vital Signs BP (!) 139/93 (BP Location: Right Arm)   Pulse 62   Temp 98.1 F (36.7 C) (Oral)   Resp 18   SpO2 95%   Physical Exam  Constitutional: He appears well-developed and well-nourished. No distress.  Patient nontoxic-appearing  HENT:  Head: Normocephalic and atraumatic.  TMs clear with good landmarks, moderate nasal mucosa edema with clear rhinorrhea, posterior oropharynx clear and moist, with some erythema, no edema or exudates,  uvula midline  Eyes: Right eye exhibits no discharge. Left eye exhibits no discharge.  Neck: Neck supple.  No rigidity  Cardiovascular: Normal rate, regular rhythm and normal heart sounds.  Pulmonary/Chest: Effort normal and breath sounds normal. No stridor. No respiratory distress. He has no wheezes. He has no rales.  Respirations equal and unlabored, patient able to speak in full sentences, lungs clear to auscultation bilaterally  Abdominal: Soft. Bowel sounds are normal. He exhibits no distension and no mass. There is no tenderness. There is no guarding.  Musculoskeletal: He exhibits no edema.  Lymphadenopathy:    He has no cervical adenopathy.  Neurological: He is alert. Coordination normal.  Skin: Skin is warm and dry. Capillary refill takes less than 2 seconds. He is not diaphoretic.  Few scattered crusted scab-like lesions all less than 1 cm over bilateral lower extremities, no drainage or pustules, no surrounding erythema or induration  Psychiatric: He has a normal mood and affect. His behavior is normal.  Nursing note and vitals reviewed.    ED Treatments / Results  Labs (all labs ordered are listed, but only abnormal results are displayed) Labs Reviewed - No data to display  EKG  EKG Interpretation None         Radiology Dg Chest 2 View  Result Date: 07/16/2017 CLINICAL DATA:  Productive cough. EXAM: CHEST  2 VIEW COMPARISON:  10/24/2013. FINDINGS: Mediastinum and hilar structures normal. 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 These are stable. Lungs are clear of acute infiltrates. No pleural effusion or pneumothorax. Heart size normal. No acute bony abnormality. IMPRESSION: No acute cardiopulmonary disease. Electronically Signed   By: Maisie Fushomas  Register   On: 07/16/2017 15:14    Procedures Procedures (including critical care time)  Medications Ordered in ED Medications - No data to display   Initial Impression / Assessment and Plan / ED Course  I have reviewed the triage vital signs and the nursing notes.  Pertinent labs & imaging results that were available during my care of the patient were reviewed by me and considered in my medical decision making (see chart for details).  You have the flu this is a viral infection that will likely start to improve after 5-7 days, antibiotics are not helpful in treating viral infections.  You may take Tamiflu twice a day for the next 5 days this will help  shorten the duration of the flu and prevent complications, this medication can cause some upset stomach, nausea and diarrhea. You may use Zofran as needed for nausea. Please make sure you are drinking plenty of fluids. You can treat your symptoms supportively with tylenol/ibuprofen for fevers and pains, Zyrtec and Flonase to heal with nasal congestion, and over the counter cough syrups and throat lozenges to help with cough. If your symptoms are not improving please follow up with you Primary doctor.   If you develop persistent fevers, shortness of breath or difficulty breathing, chest pain, severe headache and neck pain, persistent nausea and vomiting or other new or concerning symptoms return to the Emergency department.  Several scabbed crusted lesions over the  lower extremities, with previously treated for impetigo-like lesions and saw improvement will prescribe mupirocin but stressed to patient patient will need to follow-up outpatient for continued management of this as this is been a chronic problem for the past year.  Discussed signs of  worsening infection that would warrant sooner return.   Final Clinical Impressions(s) / ED Diagnoses   Final diagnoses:  Influenza  Impetigo    ED Discharge Orders        Ordered    oseltamivir (TAMIFLU) 75 MG capsule  Every 12 hours     07/16/17 1711    mupirocin cream (BACTROBAN) 2 %  2 times daily     07/16/17 1711    ondansetron (ZOFRAN ODT) 4 MG disintegrating tablet     07/16/17 1713       Dartha Lodge, [place] 07/16/17 1916    Melene Plan, DO 07/16/17 2325

## 2017-07-16 NOTE — Discharge Instructions (Signed)
You have the flu this is a viral infection that will likely start to improve after 5-7 days, antibiotics are not helpful in treating viral infections.  You may take Tamiflu twice a day for the next 5 days this will help shorten the duration of the flu and prevent complications, this medication can cause some upset stomach, nausea and diarrhea. You may use Zofran as needed for nausea.  Please make sure you are drinking plenty of fluids. You can treat your symptoms supportively with tylenol/ibuprofen for fevers and pains, Zyrtec and Flonase to help with nasal congestion, and over the counter cough syrups and throat lozenges to help with cough. If your symptoms are not improving please follow up with you Primary doctor.   If you develop persistent fevers, shortness of breath or difficulty breathing, chest pain, severe headache and neck pain, persistent nausea and vomiting or other new or concerning symptoms return to the Emergency department.  You may use mupirocin ointment on skin lesions but she will need to follow-up outpatient for this as it seems to be more of a chronic issue.

## 2017-07-16 NOTE — ED Triage Notes (Signed)
Pt reports cough that started last night with yellow sputum, diarrhea x this morning, and multiple scabs on legs that he is unsure where they came from.

## 2022-06-26 ENCOUNTER — Ambulatory Visit: Payer: Self-pay | Admitting: Family Medicine
